# Patient Record
Sex: Female | Born: 1970 | Race: White | Hispanic: No | Marital: Single | Smoking: Current every day smoker
Health system: Southern US, Community
[De-identification: ages and names within clinical notes are randomized; demographics above are authoritative.]

## PROBLEM LIST (undated history)

## (undated) DIAGNOSIS — C569 Malignant neoplasm of unspecified ovary: Secondary | ICD-10-CM

## (undated) DIAGNOSIS — I10 Essential (primary) hypertension: Secondary | ICD-10-CM

## (undated) DIAGNOSIS — D649 Anemia, unspecified: Secondary | ICD-10-CM

## (undated) HISTORY — DX: Essential (primary) hypertension: I10

## (undated) HISTORY — DX: Malignant neoplasm of unspecified ovary: C56.9

## (undated) HISTORY — PX: FEMUR SURGERY: SHX943

---

## 1989-08-09 HISTORY — PX: FEMUR SURGERY: SHX943

## 1998-11-25 ENCOUNTER — Other Ambulatory Visit: Admission: RE | Admit: 1998-11-25 | Discharge: 1998-11-25 | Payer: Self-pay | Admitting: Obstetrics & Gynecology

## 1999-05-29 ENCOUNTER — Other Ambulatory Visit: Admission: RE | Admit: 1999-05-29 | Discharge: 1999-05-29 | Payer: Self-pay | Admitting: Obstetrics & Gynecology

## 2000-02-02 ENCOUNTER — Other Ambulatory Visit: Admission: RE | Admit: 2000-02-02 | Discharge: 2000-02-02 | Payer: Self-pay | Admitting: Obstetrics & Gynecology

## 2000-07-15 ENCOUNTER — Other Ambulatory Visit: Admission: RE | Admit: 2000-07-15 | Discharge: 2000-07-15 | Payer: Self-pay | Admitting: Family Medicine

## 2002-03-07 ENCOUNTER — Emergency Department (HOSPITAL_COMMUNITY): Admission: EM | Admit: 2002-03-07 | Discharge: 2002-03-07 | Payer: Self-pay | Admitting: Emergency Medicine

## 2002-03-12 ENCOUNTER — Emergency Department (HOSPITAL_COMMUNITY): Admission: EM | Admit: 2002-03-12 | Discharge: 2002-03-12 | Payer: Self-pay | Admitting: Emergency Medicine

## 2002-04-22 ENCOUNTER — Emergency Department (HOSPITAL_COMMUNITY): Admission: EM | Admit: 2002-04-22 | Discharge: 2002-04-22 | Payer: Self-pay | Admitting: Emergency Medicine

## 2002-07-18 ENCOUNTER — Emergency Department (HOSPITAL_COMMUNITY): Admission: EM | Admit: 2002-07-18 | Discharge: 2002-07-18 | Payer: Self-pay | Admitting: Emergency Medicine

## 2002-07-19 ENCOUNTER — Encounter: Payer: Self-pay | Admitting: Emergency Medicine

## 2003-04-29 ENCOUNTER — Emergency Department (HOSPITAL_COMMUNITY): Admission: EM | Admit: 2003-04-29 | Discharge: 2003-04-29 | Payer: Self-pay | Admitting: Emergency Medicine

## 2003-11-02 ENCOUNTER — Emergency Department (HOSPITAL_COMMUNITY): Admission: EM | Admit: 2003-11-02 | Discharge: 2003-11-03 | Payer: Self-pay | Admitting: Emergency Medicine

## 2003-12-30 ENCOUNTER — Other Ambulatory Visit: Admission: RE | Admit: 2003-12-30 | Discharge: 2003-12-30 | Payer: Self-pay | Admitting: Obstetrics and Gynecology

## 2006-06-20 ENCOUNTER — Emergency Department (HOSPITAL_COMMUNITY): Admission: EM | Admit: 2006-06-20 | Discharge: 2006-06-20 | Payer: Self-pay | Admitting: Emergency Medicine

## 2006-08-17 ENCOUNTER — Emergency Department (HOSPITAL_COMMUNITY): Admission: EM | Admit: 2006-08-17 | Discharge: 2006-08-18 | Payer: Self-pay | Admitting: Emergency Medicine

## 2010-10-08 ENCOUNTER — Emergency Department (HOSPITAL_COMMUNITY)
Admission: EM | Admit: 2010-10-08 | Discharge: 2010-10-08 | Discharge status: Against Medical Advice | Payer: Self-pay | Attending: Emergency Medicine | Admitting: Emergency Medicine

## 2010-10-08 DIAGNOSIS — F410 Panic disorder [episodic paroxysmal anxiety] without agoraphobia: Secondary | ICD-10-CM | POA: Insufficient documentation

## 2011-10-22 ENCOUNTER — Encounter (HOSPITAL_COMMUNITY): Payer: Self-pay | Admitting: Emergency Medicine

## 2011-10-22 ENCOUNTER — Emergency Department (HOSPITAL_COMMUNITY)
Admission: EM | Admit: 2011-10-22 | Discharge: 2011-10-22 | Disposition: A | Discharge status: Home / Self Care | Payer: Self-pay | Attending: Emergency Medicine | Admitting: Emergency Medicine

## 2011-10-22 ENCOUNTER — Emergency Department (HOSPITAL_COMMUNITY): Payer: Self-pay

## 2011-10-22 DIAGNOSIS — R10819 Abdominal tenderness, unspecified site: Secondary | ICD-10-CM | POA: Insufficient documentation

## 2011-10-22 DIAGNOSIS — M549 Dorsalgia, unspecified: Secondary | ICD-10-CM | POA: Insufficient documentation

## 2011-10-22 DIAGNOSIS — N949 Unspecified condition associated with female genital organs and menstrual cycle: Secondary | ICD-10-CM | POA: Insufficient documentation

## 2011-10-22 DIAGNOSIS — N83202 Unspecified ovarian cyst, left side: Secondary | ICD-10-CM

## 2011-10-22 DIAGNOSIS — N898 Other specified noninflammatory disorders of vagina: Secondary | ICD-10-CM | POA: Insufficient documentation

## 2011-10-22 DIAGNOSIS — R109 Unspecified abdominal pain: Secondary | ICD-10-CM | POA: Insufficient documentation

## 2011-10-22 DIAGNOSIS — R6883 Chills (without fever): Secondary | ICD-10-CM | POA: Insufficient documentation

## 2011-10-22 DIAGNOSIS — R110 Nausea: Secondary | ICD-10-CM | POA: Insufficient documentation

## 2011-10-22 DIAGNOSIS — F17200 Nicotine dependence, unspecified, uncomplicated: Secondary | ICD-10-CM | POA: Insufficient documentation

## 2011-10-22 DIAGNOSIS — N83209 Unspecified ovarian cyst, unspecified side: Secondary | ICD-10-CM | POA: Insufficient documentation

## 2011-10-22 LAB — URINALYSIS, ROUTINE W REFLEX MICROSCOPIC
Bilirubin Urine: NEGATIVE
Glucose, UA: NEGATIVE mg/dL
Ketones, ur: NEGATIVE mg/dL
Nitrite: NEGATIVE
Protein, ur: NEGATIVE mg/dL
Specific Gravity, Urine: 1.02 (ref 1.005–1.030)
Urobilinogen, UA: 0.2 mg/dL (ref 0.0–1.0)
pH: 5.5 (ref 5.0–8.0)

## 2011-10-22 LAB — CBC
HCT: 34.8 % — ABNORMAL LOW (ref 36.0–46.0)
Hemoglobin: 11.3 g/dL — ABNORMAL LOW (ref 12.0–15.0)
MCH: 25.8 pg — ABNORMAL LOW (ref 26.0–34.0)
MCHC: 32.5 g/dL (ref 30.0–36.0)
RDW: 17.4 % — ABNORMAL HIGH (ref 11.5–15.5)

## 2011-10-22 LAB — POCT I-STAT, CHEM 8
BUN: 11 mg/dL (ref 6–23)
Chloride: 104 mEq/L (ref 96–112)
Creatinine, Ser: 0.5 mg/dL (ref 0.50–1.10)
Potassium: 4.1 mEq/L (ref 3.5–5.1)
Sodium: 138 mEq/L (ref 135–145)
TCO2: 23 mmol/L (ref 0–100)

## 2011-10-22 LAB — DIFFERENTIAL
Basophils Absolute: 0.1 10*3/uL (ref 0.0–0.1)
Basophils Relative: 1 % (ref 0–1)
Eosinophils Absolute: 0.1 10*3/uL (ref 0.0–0.7)
Eosinophils Relative: 1 % (ref 0–5)
Monocytes Absolute: 1 10*3/uL (ref 0.1–1.0)
Monocytes Relative: 7 % (ref 3–12)

## 2011-10-22 LAB — URINE MICROSCOPIC-ADD ON

## 2011-10-22 LAB — WET PREP, GENITAL

## 2011-10-22 LAB — PREGNANCY, URINE: Preg Test, Ur: NEGATIVE

## 2011-10-22 MED ORDER — MORPHINE SULFATE 4 MG/ML IJ SOLN
4.0000 mg | Freq: Once | INTRAMUSCULAR | Status: AC
  Administered 2011-10-22: 4 mg via INTRAVENOUS
  Filled 2011-10-22: qty 1

## 2011-10-22 MED ORDER — DEXTROSE 5 % IV SOLN
2.0000 g | INTRAVENOUS | Status: AC
  Administered 2011-10-22: 2 g via INTRAVENOUS
  Filled 2011-10-22: qty 2

## 2011-10-22 MED ORDER — ONDANSETRON HCL 4 MG/2ML IJ SOLN
4.0000 mg | Freq: Once | INTRAMUSCULAR | Status: AC
  Administered 2011-10-22: 4 mg via INTRAVENOUS
  Filled 2011-10-22: qty 2

## 2011-10-22 MED ORDER — PROMETHAZINE HCL 25 MG RE SUPP: 25.0000 mg | Freq: Four times a day (QID) | RECTAL | Status: AC | PRN

## 2011-10-22 MED ORDER — HYDROMORPHONE HCL PF 1 MG/ML IJ SOLN
1.0000 mg | Freq: Once | INTRAMUSCULAR | Status: AC
  Administered 2011-10-22: 1 mg via INTRAVENOUS
  Filled 2011-10-22: qty 1

## 2011-10-22 MED ORDER — DOXYCYCLINE HYCLATE 100 MG IV SOLR
100.0000 mg | INTRAVENOUS | Status: AC
  Administered 2011-10-22: 100 mg via INTRAVENOUS
  Filled 2011-10-22: qty 100

## 2011-10-22 MED ORDER — DOXYCYCLINE HYCLATE 100 MG PO CAPS: 100.0000 mg | ORAL_CAPSULE | Freq: Two times a day (BID) | ORAL | Status: AC

## 2011-10-22 MED ORDER — SODIUM CHLORIDE 0.9 % IV SOLN
Freq: Once | INTRAVENOUS | Status: AC
  Administered 2011-10-22: 16:00:00 via INTRAVENOUS

## 2011-10-22 MED ORDER — OXYCODONE-ACETAMINOPHEN 5-325 MG PO TABS: 2.0000 | ORAL_TABLET | ORAL | Status: AC | PRN

## 2011-10-22 NOTE — Discharge Instructions (Signed)
Take percocet as needed for severe pain.  Do not drive within four hours of taking this medication (may cause drowsiness or confusion).  Take promethazine as prescribed for nausea.   Follow up with Dr. Normand Sloop on Monday if possible.  You should return to the ER if you develop fever, worsening pain or uncontrolled vomiting.

## 2011-10-22 NOTE — ED Notes (Signed)
This type of pain comes on at time of menses not at end of cycle. The pain is more severe in rt. Lower abdomen. No fevers, no n/v.

## 2011-10-22 NOTE — ED Provider Notes (Cosign Needed)
History     CSN: [id number]  Arrival date & time 10/22/11  3244   First MD Initiated Contact with Patient 10/22/11 1003      Chief Complaint  Patient presents with  . Abdominal Pain    (Consider location/radiation/quality/duration/timing/severity/associated sxs/prior treatment) Patient is a 41 y.o. female presenting with abdominal pain. The history is provided by the patient.  Abdominal Pain The primary symptoms of the illness include abdominal pain, nausea and vaginal bleeding. The primary symptoms of the illness do not include fever, vomiting, diarrhea, dysuria or vaginal discharge. The current episode started 3 to 5 hours ago. The onset of the illness was sudden. The problem has been gradually worsening.  Additional symptoms associated with the illness include chills and back pain. Symptoms associated with the illness do not include anorexia, constipation, urgency, hematuria or frequency.  Pt reports lower abdominal pain radiating into left side and into left flank. States pain worsened with movement and palpation. States she is at the end of her menustrual cycle on day 6. Denies urinary symptoms. Denies fever, chills, vomiting, diarrhea. Normal bowel movement today. Denies vaginal discharge.   History reviewed. No pertinent past medical history.  History reviewed. No pertinent past surgical history.  No family history on file.  History  Substance Use Topics  . Smoking status: Current Everyday Smoker  . Smokeless tobacco: Not on file  . Alcohol Use: Yes    OB History    Grav Para Term Preterm Abortions TAB SAB Ect Mult Living                  Review of Systems  Constitutional: Positive for chills. Negative for fever.  HENT: Negative.   Eyes: Negative.   Respiratory: Negative.   Cardiovascular: Negative.   Gastrointestinal: Positive for nausea and abdominal pain. Negative for vomiting, diarrhea, constipation and anorexia.  Genitourinary: Positive for vaginal bleeding.  Negative for dysuria, urgency, frequency, hematuria and vaginal discharge.  Musculoskeletal: Positive for back pain.  Skin: Negative.   Neurological: Negative.   Psychiatric/Behavioral: Negative.     Allergies  Penicillins  Home Medications   Current Outpatient Rx  Name Route Sig Dispense Refill  . ACETAMINOPHEN 500 MG PO TABS Oral Take 1,000 mg by mouth every 6 (six) hours as needed. For pain    . ADULT MULTIVITAMIN W/MINERALS CH Oral Take 1 tablet by mouth every other day.      BP 132/77  Pulse 116  Temp 98.3 F (36.8 C)  Resp 20  SpO2 99%  Physical Exam  Nursing note and vitals reviewed. Constitutional: She is oriented to person, place, and time. She appears well-developed and well-nourished.  HENT:  Head: Normocephalic.  Eyes: Conjunctivae are normal.  Neck: Neck supple.  Cardiovascular: Normal rate and regular rhythm.   Pulmonary/Chest: Effort normal and breath sounds normal.  Abdominal: Soft. Bowel sounds are normal. She exhibits no distension.       Suprapubic tenderness. No CVA tenderness  Genitourinary: Vagina normal. Uterus is tender. Cervix exhibits motion tenderness. Left adnexum displays tenderness.       Moderate vaginal bleeding with mucus noted  Musculoskeletal: Normal range of motion.  Neurological: She is alert and oriented to person, place, and time.  Skin: Skin is warm and dry.  Psychiatric: She has a normal mood and affect.    ED Course  Procedures (including critical care time)  Labs Reviewed  URINALYSIS, ROUTINE W REFLEX MICROSCOPIC - Abnormal; Notable for the following:    APPearance CLOUDY (*)  Hgb urine dipstick LARGE (*)    Leukocytes, UA LARGE (*)    All other components within normal limits  CBC - Abnormal; Notable for the following:    WBC 14.5 (*)    Hemoglobin 11.3 (*)    HCT 34.8 (*)    MCH 25.8 (*)    RDW 17.4 (*)    All other components within normal limits  DIFFERENTIAL - Abnormal; Notable for the following:    Neutro  Abs 10.9 (*)    All other components within normal limits  WET PREP, GENITAL - Abnormal; Notable for the following:    WBC, Wet Prep HPF POC MANY (*)    All other components within normal limits  POCT I-STAT, CHEM 8 - Abnormal; Notable for the following:    Glucose, Bld 111 (*)    All other components within normal limits  URINE MICROSCOPIC-ADD ON - Abnormal; Notable for the following:    Squamous Epithelial / LPF FEW (*)    All other components within normal limits  PREGNANCY, URINE  GC/CHLAMYDIA PROBE AMP, GENITAL  URINE CULTURE   No results found.  3:54 PM Pt with possible TOA vs torsed fallopian tube vs cystic structure mass over left adnexa. Spoke with Dr. Normand Sloop GYN, does not think this is TOA, pt afebrile. Also nothing needs to be done for torsed fallopian tube. If can get pain under control. Pt ok to be d/c home with follow up and antibiotics. She is to follow up with Dr. Normand Sloop in the office or go to [facility] if worsening.    No diagnosis found.    MDM          Lottie Mussel, PA 10/22/11 1635

## 2011-10-22 NOTE — ED Notes (Signed)
Lower abd  Rads to left flank and back  Stabbing pain  Denies dysuria started last night

## 2011-10-22 NOTE — ED Notes (Signed)
Urine culture sent as add-on

## 2011-10-22 NOTE — ED Notes (Signed)
Father in waiting room to pick up patient; patient aware.

## 2011-10-22 NOTE — ED Provider Notes (Signed)
Pt receiving IV abx in CDU for possible TOA.  She currently looks well and VSS.  Reports that the pain in her left lower abdomen is returning. Will treat w/ another dose of IV dilaudid and reassess when her abx are completed.  5:52 PM   Pt reports that pain has improved.  She is tolerating pos.  Abx are complete.  D/c'd home w/ percocet, promethazine and referral to Dr. Normand Sloop.  Strict return precautions discussed.   Susan Harper [place], [place] 10/22/11 1925

## 2011-10-23 LAB — URINE CULTURE: Culture: NO GROWTH

## 2011-10-26 NOTE — ED Notes (Signed)
+  GC Chart sent to EDP office for review.  

## 2011-11-01 ENCOUNTER — Emergency Department (HOSPITAL_COMMUNITY)
Admission: EM | Admit: 2011-11-01 | Discharge: 2011-11-01 | Disposition: A | Discharge status: Home / Self Care | Payer: Self-pay | Attending: Emergency Medicine | Admitting: Emergency Medicine

## 2011-11-01 ENCOUNTER — Encounter (HOSPITAL_COMMUNITY): Payer: Self-pay

## 2011-11-01 DIAGNOSIS — A5400 Gonococcal infection of lower genitourinary tract, unspecified: Secondary | ICD-10-CM | POA: Insufficient documentation

## 2011-11-01 DIAGNOSIS — A549 Gonococcal infection, unspecified: Secondary | ICD-10-CM

## 2011-11-01 DIAGNOSIS — F17200 Nicotine dependence, unspecified, uncomplicated: Secondary | ICD-10-CM | POA: Insufficient documentation

## 2011-11-01 MED ORDER — AZITHROMYCIN 1 G PO PACK
2.0000 g | PACK | Freq: Once | ORAL | Status: AC
  Administered 2011-11-01: 2 g via ORAL
  Filled 2011-11-01: qty 1

## 2011-11-01 MED ORDER — ONDANSETRON 4 MG PO TBDP
4.0000 mg | ORAL_TABLET | Freq: Once | ORAL | Status: AC
  Administered 2011-11-01: 4 mg via ORAL
  Filled 2011-11-01: qty 1

## 2011-11-01 NOTE — Discharge Instructions (Signed)
 RESOURCE GUIDE  Dental Problems  Patients with Medicaid: [facility]                     [facility] [address]                                           [address] Phone:  [phone]                                                  Phone:  [phone]  If unable to pay or uninsured, contact:  [facility] or [facility] to become qualified for the adult dental clinic.  Chronic Pain Problems Contact [facility]  [phone] Patients need to be referred by their primary care doctor.  Insufficient Money for Medicine Contact [employer]:  call "211" or [facility] 271-5999.  No Primary Care Doctor Call [facility]  832-8000 Other agencies that provide inexpensive medical care    [facility]  [phone]    [facility]  [phone]    [facility]  [phone]    Women[facility] Clinic  [phone]    [facility]  [phone]    [facility]  [phone]  Psychological Services [facility]  [phone] [facility]  [phone] [facility]   [phone] (emergency services 641-4993)  Substance Abuse Resources [facility]  336-882-2125 [facility] 336-784-9470 [facility] 336-285-9073 [facility] 336-845-3988 Residential & Outpatient Substance Abuse Program  800-659-3381  Abuse/Neglect [place] Child Abuse Hotline (336) 641-3795 [place] Child Abuse Hotline 800-378-5315 (After Hours)  Emergency Shelter [facility] (336) 271-5985  [facility] (336) 275-9566 [facility] (704) 372-4663  MRSA Hotline #:   832-7006    [place] [facility]  [facility] [place]     [employer]                          [facility] [address] [place]                       [address]      [address]  [place]                                                [place]                            [place] Phone:  [phone]                                   Phone:  [phone]                 Phone:  [phone]  [facility] Phone:    [phone]  [place] Child Abuse Hotline (336) 342-1394 (336) 342-3537 (After Hours)   

## 2011-11-01 NOTE — ED Provider Notes (Signed)
History    41 year old female presenting after she was called a positive gonorrhea culture. Patient reports that she still is having some mild lower abdominal pain. Afebrile. Continued mild vaginal discharge. Back pain. No urinary complaints. Patient reports her son does have improved since last evaluation and just no new complaints at this time. CSN: [id number]  Arrival date & time 11/01/11  1131   First MD Initiated Contact with Patient 11/01/11 1227      Chief Complaint  Patient presents with  . Vaginal Discharge    PT called back for vag culture results    (Consider location/radiation/quality/duration/timing/severity/associated sxs/prior treatment) HPI  No past medical history on file.  No past surgical history on file.  No family history on file.  History  Substance Use Topics  . Smoking status: Current Everyday Smoker  . Smokeless tobacco: Not on file  . Alcohol Use: Yes    OB History    Grav Para Term Preterm Abortions TAB SAB Ect Mult Living                  Review of Systems  Review of symptoms negative unless otherwise noted in HPI.  Allergies  Penicillins  Home Medications   Current Outpatient Rx  Name Route Sig Dispense Refill  . ACETAMINOPHEN 500 MG PO TABS Oral Take 1,000 mg by mouth every 6 (six) hours as needed. For pain    . DOXYCYCLINE HYCLATE 100 MG PO CAPS Oral Take 1 capsule (100 mg total) by mouth 2 (two) times daily. 14 capsule 0  . OXYCODONE-ACETAMINOPHEN 5-325 MG PO TABS Oral Take 2 tablets by mouth every 4 (four) hours as needed for pain. 20 tablet 0    BP 131/88  Pulse 99  Temp(Src) 98.3 F (36.8 C) (Oral)  Resp 18  SpO2 97%  LMP 10/17/2011  Physical Exam  Nursing note and vitals reviewed. Constitutional: She appears well-developed and well-nourished. No distress.  HENT:  Head: Normocephalic and atraumatic.  Eyes: Conjunctivae are normal. Right eye exhibits no discharge. Left eye exhibits no discharge.  Neck: Neck supple.    Cardiovascular: Normal rate, regular rhythm and normal heart sounds.  Exam reveals no gallop and no friction rub.   No murmur heard. Pulmonary/Chest: Effort normal and breath sounds normal. No respiratory distress.  Abdominal: Soft. She exhibits no distension. There is no tenderness.  Genitourinary:       No CVA tenderness  Musculoskeletal: She exhibits no edema and no tenderness.  Neurological: She is alert.  Skin: Skin is warm and dry.  Psychiatric: She has a normal mood and affect. Her behavior is normal. Thought content normal.    ED Course  Procedures (including critical care time)  Labs Reviewed - No data to display No results found.   1. Gonococcal infection       MDM  41 year old female with gonococcal infection. Patient is penicillin allergic so 2 g of azithromycin were given as a one-time dose. Abdominal exam benign. Low clinical suspicion for tubo-ovarian abscess or generally for surgical abdomen. Return precautions were discussed particularly given large cystic mass seen on previous ultrasound although it is unclear if specifically related to gonococcal infection. Patient is afebrile well-appearing on exam. Again encouraged to followup with GYN. Encouraged patient to talk with her sexual partners and to refrain from sexual activity until they have been tested and treated.        Raeford Razor, MD 11/12/11 [phone]

## 2011-11-02 NOTE — ED Notes (Signed)
Message left with father to have pt call office. (Need to question pt re: treatment of STD.)

## 2011-11-04 NOTE — ED Provider Notes (Signed)
Evaluation and management procedures were performed by the PA/NP under my supervision/collaboration.   Janeen Watson D Khylin Gutridge, MD 11/04/11 1021 

## 2011-11-08 NOTE — ED Notes (Signed)
Chart received from EDP 3/21 instructing patient to return to ED for treatment due to PCN allergy. Patient returned to ED on 3/25 and was treated with Zithromax two grams PO. [facility] faxed

## 2013-02-10 IMAGING — US US ART/VEN ABD/PELV/SCROTUM DOPPLER LTD
1 series · 12 of 25 positions shown · non-contrast
Comparison: None.

CLINICAL DATA: Abdominal and pelvic pain.  LMP 10/17/2011

TRANSABDOMINAL AND TRANSVAGINAL ULTRASOUND OF PELVIS
DOPPLER ULTRASOUND OF OVARIES
TECHNIQUE: Both transabdominal and transvaginal ultrasound
examinations of the pelvis were performed. Transabdominal technique
was performed for global imaging of the pelvis including uterus,
ovaries, adnexal regions, and pelvic cul-de-sac.
It was necessary to proceed with endovaginal exam following the
transabdominal exam to visualize the endometrium, myometrium and
adnexa.
Color and duplex Doppler ultrasound was utilized to evaluate blood
flow to the ovaries.

[Series 1: us art/ven abd/pelv/scrotum doppler ltd · 0.31mm/px · 12 of 66 slices shown]
[im 3/66]
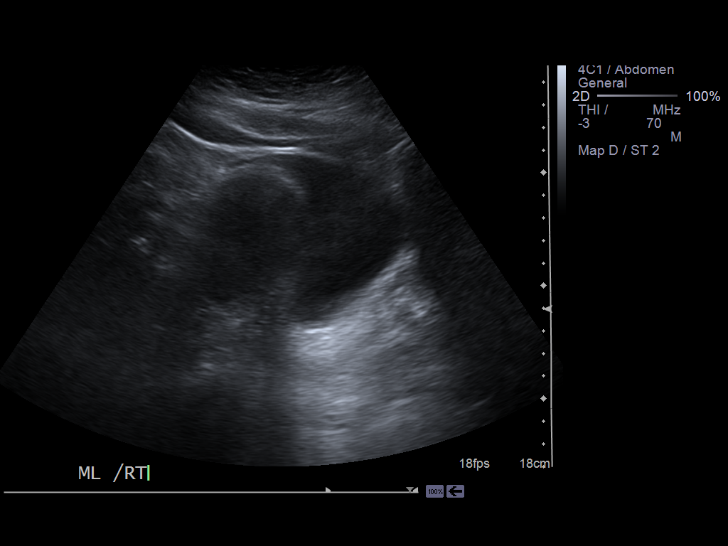
[im 9/66]
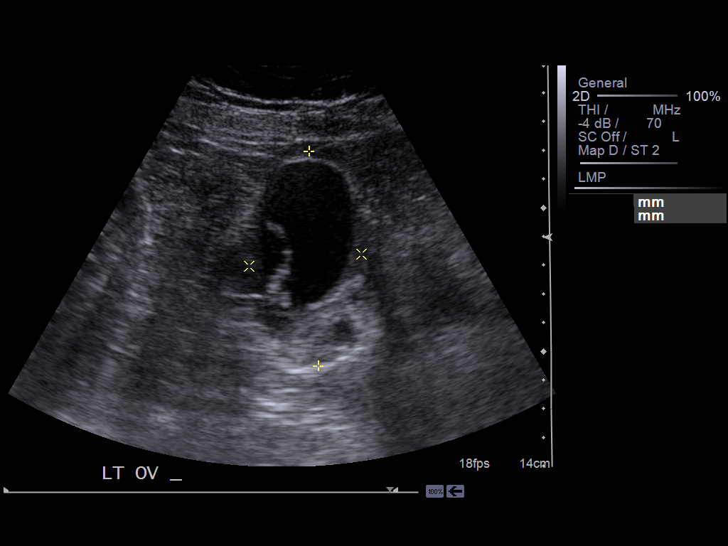
[im 14/66]
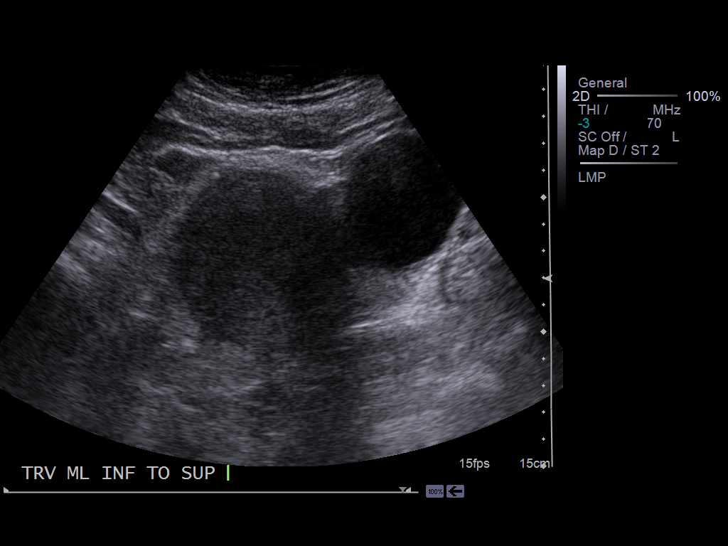
[im 19/66]
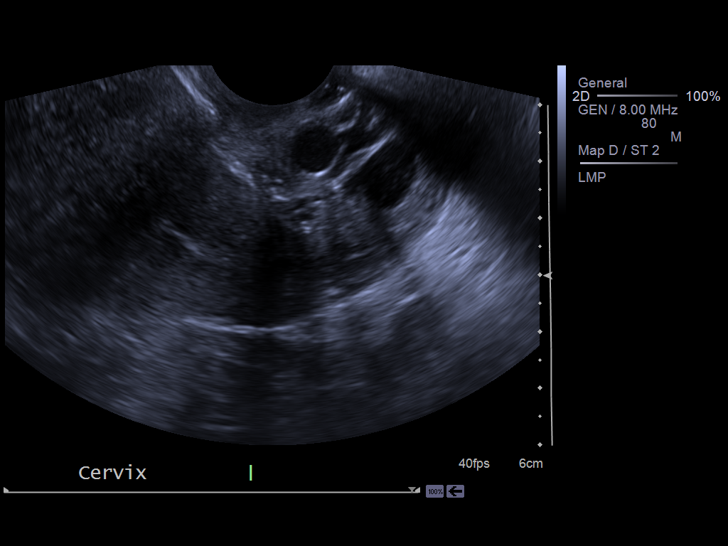
[im 25/66]
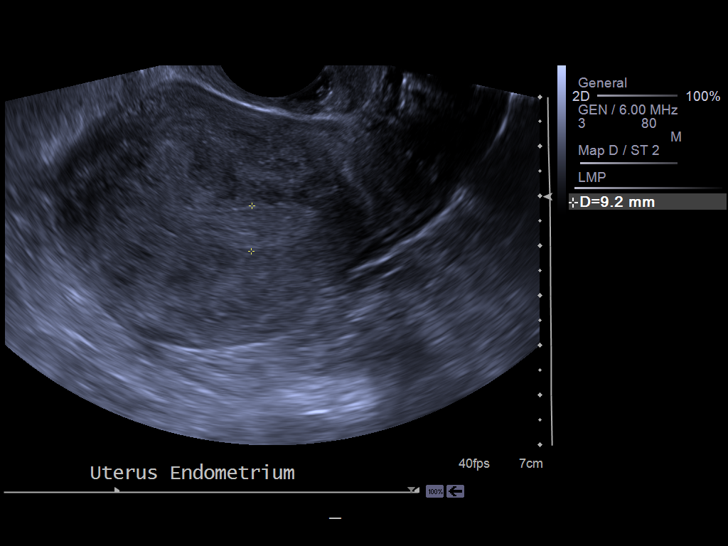
[im 30/66]
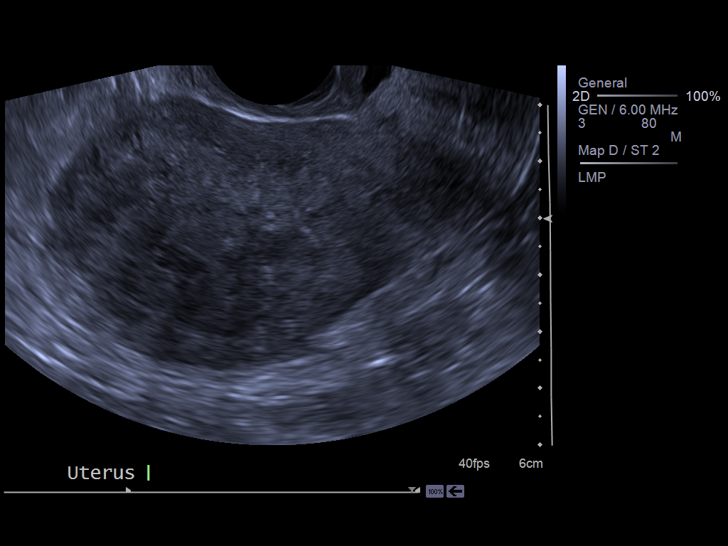
[im 36/66]
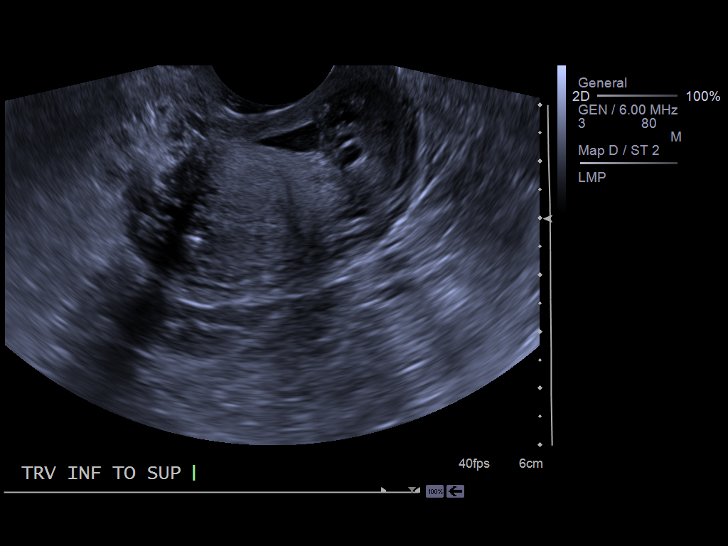
[im 41/66]
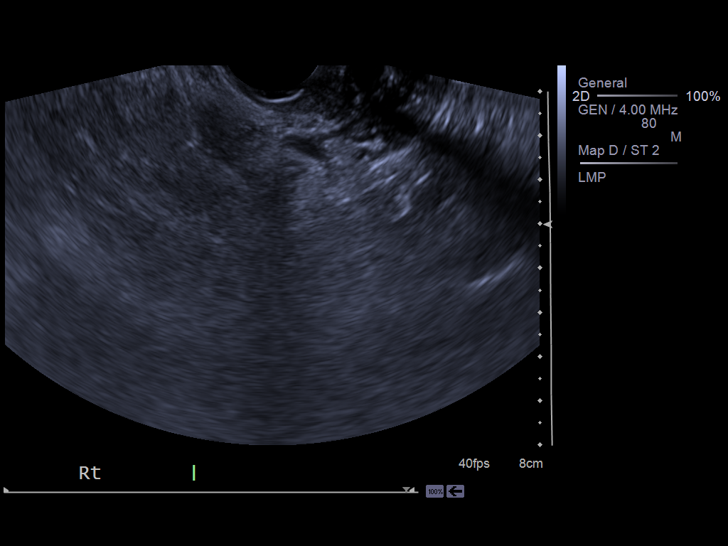
[im 47/66]
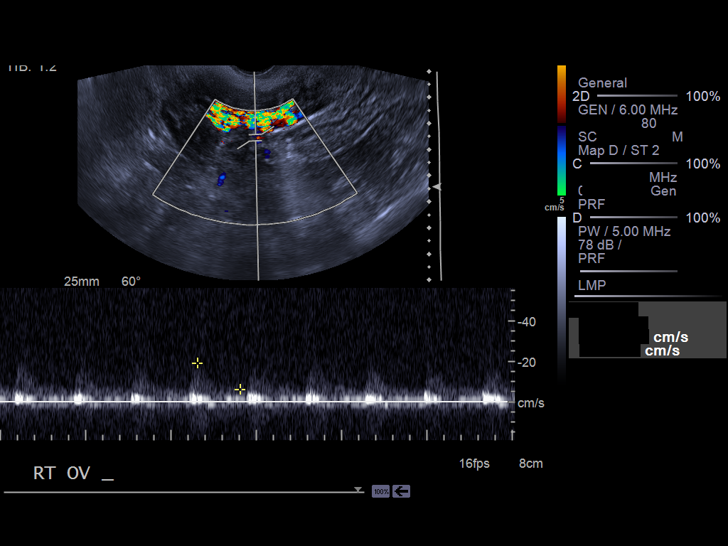
[im 52/66]
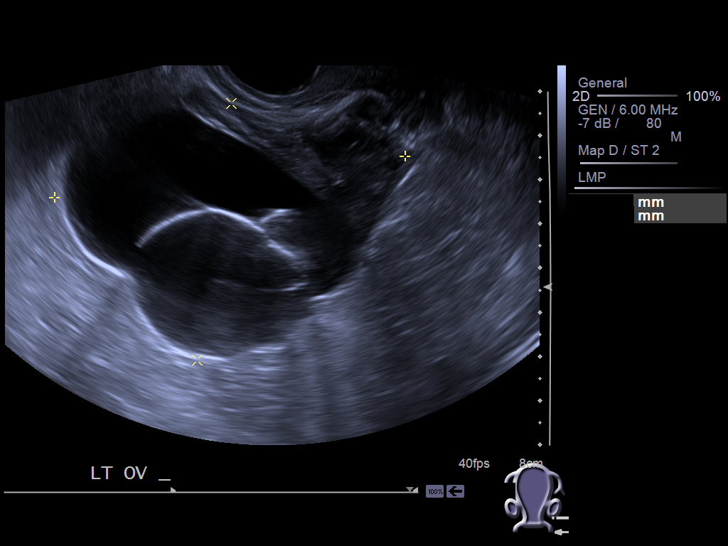
[im 57/66]
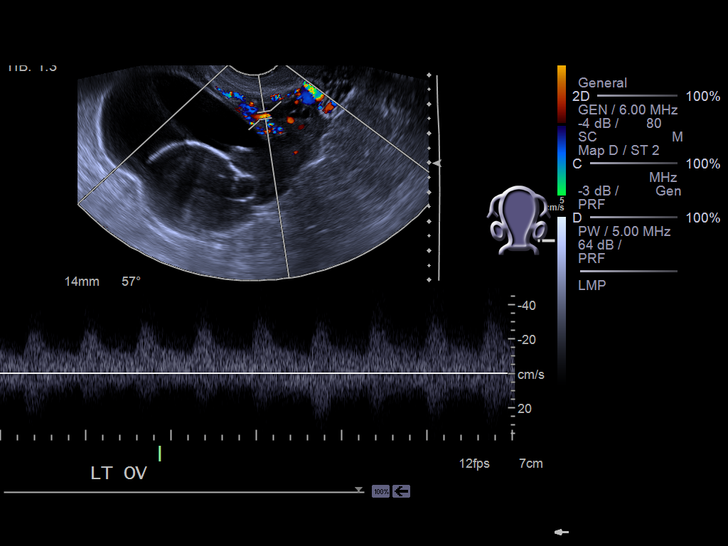
[im 63/66]
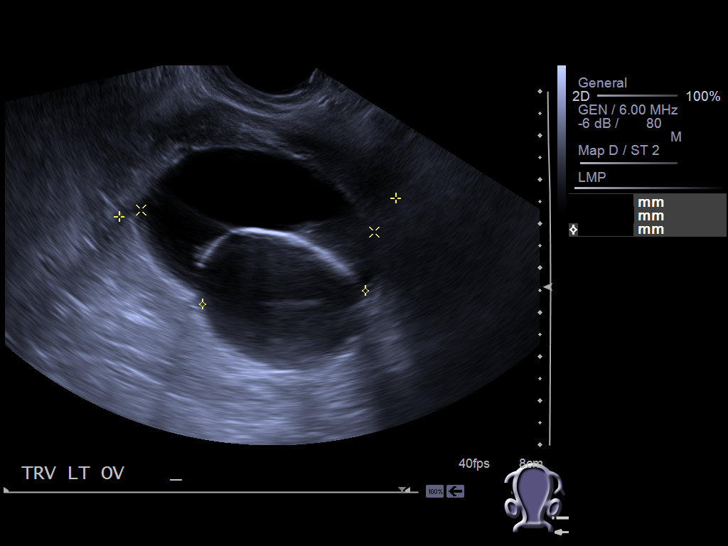

[12 of 25 positions shown; findings below may reference images not displayed]

FINDINGS: Uterus:  The uterus demonstrates a sagittal length of 9.2 cm, depth
of 5.0 cm and width of 6.3 cm. Overall the myometrium appears
heterogeneous with at least three areas of focally altered
echotexture appear more suspicious for focal fibroids, one mural
located in the anterior upper uterine segment measuring 1.4 x 0.8 x
1.0 cm, a second mural located in the posterior upper uterine
segment measuring 1.2 x 1.5 x 1.5 cm and a third in the posterior
upper uterine segment with a small subserosal component measuring
1.2 by 1.2 x 0.9 cm. The more diffuse heterogeneity combined with a
poor endometrial myometrial interface may indicate associated
adenomyosis.

Endometrium: myometrial interface is poorly defined.  The
visualized portion of the endometrial lining appears echogenic with
a width of 9 mm.

Right ovary: Measures 3.0 x 2.5 x 2.0 cm and is poorly resolved due
to the location on the endovaginal exam color Doppler reveals the
presence of flow within this ovary

Left ovary:   Normal ovarian stroma is identified measuring 1.7 by
2.1 x 2.3 cm.  Adjacent to this is a multicystic mass which
measures 6.7 by 4.7 by 3.8 cm.  It is difficult to tell whether
this is contiguous with or adjacent to the left ovary.  The cystic
mass is avascular and contains several thin walled rounded cystic
components with some internal echoes noted. No wall nodularity is
seen.  This could represent either a dilated tube or multicystic
ovarian mass.

Pulsed Doppler evaluation demonstrates normal low-resistance
arterial and venous waveforms in the right ovary and in the normal
appearing ovarian stroma on the left.
IMPRESSION: Complex cystic left adnexal mass which is questionably adjacent to
or contiguous with the left ovary.  This could represent either a
hydrosalpinx with some hemorrhage or a cystic ovarian mass.  In the
appropriate clinical setting, a torsed fallopian tube could be
considered.  Further characterization with pelvic MRI may be able
to help distinguish between these possibilities.

Uterine fibroids with question of uterine adenomyosis.

Normal right ovary.

This report was discussed with Samibyan Zampavion who reports the
presence of white cells on wet prep. Given this history a
pyosalpinx could also cause this appearance.

## 2015-09-08 ENCOUNTER — Encounter (HOSPITAL_BASED_OUTPATIENT_CLINIC_OR_DEPARTMENT_OTHER): Payer: Self-pay | Admitting: *Deleted

## 2015-09-08 ENCOUNTER — Emergency Department (HOSPITAL_BASED_OUTPATIENT_CLINIC_OR_DEPARTMENT_OTHER): Payer: Self-pay

## 2015-09-08 ENCOUNTER — Emergency Department (HOSPITAL_BASED_OUTPATIENT_CLINIC_OR_DEPARTMENT_OTHER)
Admission: EM | Admit: 2015-09-08 | Discharge: 2015-09-08 | Disposition: A | Discharge status: Home / Self Care | Payer: Self-pay | Attending: Emergency Medicine | Admitting: Emergency Medicine

## 2015-09-08 DIAGNOSIS — Z880 Allergy status to penicillin: Secondary | ICD-10-CM | POA: Insufficient documentation

## 2015-09-08 DIAGNOSIS — M7711 Lateral epicondylitis, right elbow: Secondary | ICD-10-CM | POA: Insufficient documentation

## 2015-09-08 DIAGNOSIS — F17210 Nicotine dependence, cigarettes, uncomplicated: Secondary | ICD-10-CM | POA: Insufficient documentation

## 2015-09-08 MED ORDER — TRAMADOL HCL 50 MG PO TABS: 50.0000 mg | ORAL_TABLET | Freq: Four times a day (QID) | ORAL | Status: DC | PRN

## 2015-09-08 MED FILL — traMADol HCL 50 MG TABS: 50 | 5 days supply | Qty: 20 | Fill #0

## 2015-09-08 NOTE — Discharge Instructions (Signed)
It was our pleasure to provide your ER care today - we hope that you feel better.  Try to rest area, avoid repetitive use of elbow, and/or repetitive lifting for 3-4 days.  Try orthotic band/strap for symptom relief.  Ice/cold to sore area.  Take motrin or aleve as need for pain.  You may also take ultram as need for pain - no driving when taking.  Follow up with orthopedist in the next few weeks for recheck if symptoms fail to improve/resolve.  Return to ER if worse, new symptoms, fevers, spreading redness, other concern.    Tennis Elbow Tennis elbow (lateral epicondylitis) is inflammation of the outer tendons of your forearm close to your elbow. Your tendons attach your muscles to your bones. The outer tendons of your forearm are used to extend your wrist, and they attach on the outside part of your elbow. Tennis elbow is often found in people who play tennis, but anyone may get the condition from repeatedly extending the wrist or turning the forearm. CAUSES This condition is caused by repeatedly extending your wrist and using your hands. It can result from sports or work that requires repetitive forearm movements. Tennis elbow may also be caused by an injury. RISK FACTORS You have a higher risk of developing tennis elbow if you play tennis or another racquet sport. You also have a higher risk if you frequently use your hands for work. This condition is also more likely to develop in:  Musicians.  Carpenters, painters, and plumbers.  Cooks.  Cashiers.  People who work in [employer].  Architect workers.  Butchers.  People who use computers. SYMPTOMS Symptoms of this condition include:  Pain and tenderness in your forearm and the outer part of your elbow. You may only feel the pain when you use your arm, or you may feel it even when you are not using your arm.  A burning feeling that runs from your elbow through your arm.  Weak grip in your hands. DIAGNOSIS  This  condition may be diagnosed by medical history and physical exam. You may also have other tests, including:  X-rays.  MRI. TREATMENT Your health care provider will recommend lifestyle adjustments, such as resting and icing your arm. Treatment may also include:  Medicines for inflammation. This may include shots of cortisone if your pain continues.  Physical therapy. This may include massage or exercises.  An elbow brace. Surgery may eventually be recommended if your pain does not go away with treatment. HOME CARE INSTRUCTIONS Activity  Rest your elbow and wrist as directed by your health care provider. Try to avoid any activities that caused the problem until your health care provider says that you can do them again.  If a physical therapist teaches you exercises, do all of them as directed.  If you lift an object, lift it with your palm facing upward. This lowers the stress on your elbow. Lifestyle  If your tennis elbow is caused by sports, check your equipment and make sure that:  You are using it correctly.  It is the best fit for you.  If your tennis elbow is caused by work, take breaks frequently, if you are able. Talk with your manager about how to best perform tasks in a way that is safe.  If your tennis elbow is caused by computer use, talk with your manager about any changes that can be made to your work environment. General Instructions  If directed, apply ice to the painful area:  Put ice in a plastic bag.  Place a towel between your skin and the bag.  Leave the ice on for 20 minutes, 2-3 times per day.  Take medicines only as directed by your health care provider.  If you were given a brace, wear it as directed by your health care provider.  Keep all follow-up visits as directed by your health care provider. This is important. SEEK MEDICAL CARE IF:  Your pain does not get better with treatment.  Your pain gets worse.  You have numbness or weakness in  your forearm, hand, or fingers.   This information is not intended to replace advice given to you by your health care provider. Make sure you discuss any questions you have with your health care provider.   Document Released: 07/26/2005 Document Revised: 12/10/2014 Document Reviewed: 07/22/2014 Elsevier Interactive Patient Education 2016 [place].   Cryotherapy Cryotherapy means treatment with cold. Ice or gel packs can be used to reduce both pain and swelling. Ice is the most helpful within the first 24 to 48 hours after an injury or flare-up from overusing a muscle or joint. Sprains, strains, spasms, burning pain, shooting pain, and aches can all be eased with ice. Ice can also be used when recovering from surgery. Ice is effective, has very few side effects, and is safe for most people to use. PRECAUTIONS  Ice is not a safe treatment option for people with:  Raynaud phenomenon. This is a condition affecting small blood vessels in the extremities. Exposure to cold may cause your problems to return.  Cold hypersensitivity. There are many forms of cold hypersensitivity, including:  Cold urticaria. Red, itchy hives appear on the skin when the tissues begin to warm after being iced.  Cold erythema. This is a red, itchy rash caused by exposure to cold.  Cold hemoglobinuria. Red blood cells break down when the tissues begin to warm after being iced. The hemoglobin that carry oxygen are passed into the urine because they cannot combine with blood proteins fast enough.  Numbness or altered sensitivity in the area being iced. If you have any of the following conditions, do not use ice until you have discussed cryotherapy with your caregiver:  Heart conditions, such as arrhythmia, angina, or chronic heart disease.  High blood pressure.  Healing wounds or open skin in the area being iced.  Current infections.  Rheumatoid arthritis.  Poor circulation.  Diabetes. Ice slows the blood  flow in the region it is applied. This is beneficial when trying to stop inflamed tissues from spreading irritating chemicals to surrounding tissues. However, if you expose your skin to cold temperatures for too long or without the proper protection, you can damage your skin or nerves. Watch for signs of skin damage due to cold. HOME CARE INSTRUCTIONS Follow these tips to use ice and cold packs safely.  Place a dry or damp towel between the ice and skin. A damp towel will cool the skin more quickly, so you may need to shorten the time that the ice is used.  For a more rapid response, add gentle compression to the ice.  Ice for no more than 10 to 20 minutes at a time. The bonier the area you are icing, the less time it will take to get the benefits of ice.  Check your skin after 5 minutes to make sure there are no signs of a poor response to cold or skin damage.  Rest 20 minutes or more between uses.  Once your skin is numb, you can end your treatment. You can test numbness by very lightly touching your skin. The touch should be so light that you do not see the skin dimple from the pressure of your fingertip. When using ice, most people will feel these normal sensations in this order: cold, burning, aching, and numbness.  Do not use ice on someone who cannot communicate their responses to pain, such as small children or people with dementia. HOW TO MAKE AN ICE PACK Ice packs are the most common way to use ice therapy. Other methods include ice massage, ice baths, and cryosprays. Muscle creams that cause a cold, tingly feeling do not offer the same benefits that ice offers and should not be used as a substitute unless recommended by your caregiver. To make an ice pack, do one of the following:  Place crushed ice or a bag of frozen vegetables in a sealable plastic bag. Squeeze out the excess air. Place this bag inside another plastic bag. Slide the bag into a pillowcase or place a damp towel  between your skin and the bag.  Mix 3 parts water with 1 part rubbing alcohol. Freeze the mixture in a sealable plastic bag. When you remove the mixture from the freezer, it will be slushy. Squeeze out the excess air. Place this bag inside another plastic bag. Slide the bag into a pillowcase or place a damp towel between your skin and the bag. SEEK MEDICAL CARE IF:  You develop white spots on your skin. This may give the skin a blotchy (mottled) appearance.  Your skin turns blue or pale.  Your skin becomes waxy or hard.  Your swelling gets worse. MAKE SURE YOU:   Understand these instructions.  Will watch your condition.  Will get help right away if you are not doing well or get worse.   This information is not intended to replace advice given to you by your health care provider. Make sure you discuss any questions you have with your health care provider.   Document Released: 03/22/2011 Document Revised: 08/16/2014 Document Reviewed: 03/22/2011 Elsevier Interactive Patient Education [employer].

## 2015-09-08 NOTE — ED Notes (Signed)
Right elbow pain x 2 months. Pain started after hitting her arm but it has gotten worse over the past 2 days.

## 2015-09-08 NOTE — ED Provider Notes (Signed)
CSN: [id number]     Arrival date & time 09/08/15  1059 History   First MD Initiated Contact with Patient 09/08/15 1232     Chief Complaint  Patient presents with  . Elbow Pain     (Consider location/radiation/quality/duration/timing/severity/associated sxs/prior Treatment) The history is provided by the patient.  Patient c/o right elbow pain for the past 2 months. States initially had struck elbow on something at onset pain. Pain persists. Works as Educational psychologist. Repetitive lifting/bending of arm. Movement and palpation makes worse. No acute or abrupt worsening today or this week. No redness. No sports injury, does not play racquet sports or golf. No fever or chills. No numbness/weakness.     History reviewed. No pertinent past medical history. Past Surgical History  Procedure Laterality Date  . Femur surgery     No family history on file. Social History  Substance Use Topics  . Smoking status: Current Every Day Smoker -- 1.00 packs/day    Types: Cigarettes  . Smokeless tobacco: None  . Alcohol Use: Yes   OB History    No data available     Review of Systems  Constitutional: Negative for fever and chills.  Musculoskeletal: Negative for neck pain.  Skin: Negative for rash and wound.  Neurological: Negative for weakness and numbness.      Allergies  Penicillins  Home Medications   Prior to Admission medications   Medication Sig Start Date End Date Taking? Authorizing Provider  acetaminophen (TYLENOL) 500 MG tablet Take 1,000 mg by mouth every 6 (six) hours as needed. For pain    Historical Provider, MD   BP 149/85 mmHg  Pulse 94  Temp(Src) 98.1 F (36.7 C) (Oral)  Resp 16  Ht 5' 5.5" (1.664 m)  Wt 86.183 kg  BMI 31.13 kg/m2  SpO2 100%  LMP 09/01/2015 Physical Exam  Constitutional: She appears well-developed and well-nourished. No distress.  Eyes: Conjunctivae are normal. No scleral icterus.  Neck: Neck supple.  Cardiovascular: Intact distal pulses.    Pulmonary/Chest: Effort normal. No respiratory distress.  Abdominal: Normal appearance.  Musculoskeletal:  Tenderness lateral elbow, lateral epicondyle. No pain w passive rom elbow. Radial pulse 2+. No erythema or increased warmth.   Neurological: She is alert.  RUE/hand, motor 5/5. sens grossly intact.   Skin: Skin is warm and dry. No rash noted. She is not diaphoretic.  Psychiatric: She has a normal mood and affect.  Nursing note and vitals reviewed.   ED Course  Procedures (including critical care time)  Dg Elbow Complete Right  09/08/2015  CLINICAL DATA:  Eight week history of elbow pain.  No known injury. EXAM: RIGHT ELBOW - COMPLETE 3+ VIEW COMPARISON:  None. FINDINGS: The joint spaces are maintained. No acute fracture is identified. No osteochondral abnormality or joint effusion. IMPRESSION: No acute bony findings or joint effusion. Electronically Signed   By: Marijo Sanes M.D.   On: 09/08/2015 11:48   I have personally reviewed and evaluated these images as part of my medical decision-making.    MDM   Exam appears c/w lateral epicondylitis.   rec nsaid.  Pt requests additional pain rx - will give rx ultram.    Discussed rest, ice, PT, band/strap, and ortho f/u if fails to improve.     Lajean Saver, MD 09/08/15 1240

## 2015-10-11 ENCOUNTER — Emergency Department (HOSPITAL_COMMUNITY): Payer: Self-pay

## 2015-10-11 ENCOUNTER — Observation Stay (HOSPITAL_COMMUNITY)
Admission: EM | Admit: 2015-10-11 | Discharge: 2015-10-12 | Disposition: A | Discharge status: Home / Self Care | Payer: Self-pay | Attending: Internal Medicine | Admitting: Internal Medicine

## 2015-10-11 ENCOUNTER — Encounter (HOSPITAL_COMMUNITY): Payer: Self-pay | Admitting: Emergency Medicine

## 2015-10-11 DIAGNOSIS — F17210 Nicotine dependence, cigarettes, uncomplicated: Secondary | ICD-10-CM | POA: Insufficient documentation

## 2015-10-11 DIAGNOSIS — Z23 Encounter for immunization: Secondary | ICD-10-CM | POA: Insufficient documentation

## 2015-10-11 DIAGNOSIS — F17200 Nicotine dependence, unspecified, uncomplicated: Secondary | ICD-10-CM

## 2015-10-11 DIAGNOSIS — J189 Pneumonia, unspecified organism: Secondary | ICD-10-CM | POA: Diagnosis present

## 2015-10-11 DIAGNOSIS — D509 Iron deficiency anemia, unspecified: Secondary | ICD-10-CM | POA: Insufficient documentation

## 2015-10-11 DIAGNOSIS — J1100 Influenza due to unidentified influenza virus with unspecified type of pneumonia: Principal | ICD-10-CM

## 2015-10-11 HISTORY — DX: Anemia, unspecified: D64.9

## 2015-10-11 LAB — CBC WITH DIFFERENTIAL/PLATELET
Basophils Absolute: 0 10*3/uL (ref 0.0–0.1)
Basophils Relative: 0 %
EOS PCT: 2 %
Eosinophils Absolute: 0.2 10*3/uL (ref 0.0–0.7)
HCT: 31 % — ABNORMAL LOW (ref 36.0–46.0)
Hemoglobin: 9.1 g/dL — ABNORMAL LOW (ref 12.0–15.0)
LYMPHS ABS: 0.6 10*3/uL — AB (ref 0.7–4.0)
Lymphocytes Relative: 6 %
MCH: 21.4 pg — AB (ref 26.0–34.0)
MCHC: 29.4 g/dL — ABNORMAL LOW (ref 30.0–36.0)
MCV: 72.9 fL — AB (ref 78.0–100.0)
MONOS PCT: 4 %
Monocytes Absolute: 0.4 10*3/uL (ref 0.1–1.0)
NEUTROS ABS: 8.8 10*3/uL — AB (ref 1.7–7.7)
Neutrophils Relative %: 88 %
PLATELETS: 353 10*3/uL (ref 150–400)
RBC: 4.25 MIL/uL (ref 3.87–5.11)
RDW: 17.7 % — AB (ref 11.5–15.5)
WBC: 10 10*3/uL (ref 4.0–10.5)

## 2015-10-11 LAB — FERRITIN: FERRITIN: 10 ng/mL — AB (ref 11–307)

## 2015-10-11 LAB — INFLUENZA PANEL BY PCR (TYPE A & B)
H1N1FLUPCR: NOT DETECTED
Influenza A By PCR: POSITIVE — AB
Influenza B By PCR: NEGATIVE

## 2015-10-11 LAB — BASIC METABOLIC PANEL
ANION GAP: 10 (ref 5–15)
BUN: 8 mg/dL (ref 6–20)
CALCIUM: 9.6 mg/dL (ref 8.9–10.3)
CO2: 23 mmol/L (ref 22–32)
CREATININE: 0.39 mg/dL — AB (ref 0.44–1.00)
Chloride: 107 mmol/L (ref 101–111)
Glucose, Bld: 104 mg/dL — ABNORMAL HIGH (ref 65–99)
Potassium: 4.2 mmol/L (ref 3.5–5.1)
Sodium: 140 mmol/L (ref 135–145)

## 2015-10-11 LAB — IRON AND TIBC
IRON: 13 ug/dL — AB (ref 28–170)
SATURATION RATIOS: 2 % — AB (ref 10.4–31.8)
TIBC: 549 ug/dL — ABNORMAL HIGH (ref 250–450)
UIBC: 536 ug/dL

## 2015-10-11 LAB — I-STAT TROPONIN, ED: Troponin i, poc: 0 ng/mL (ref 0.00–0.08)

## 2015-10-11 LAB — I-STAT CG4 LACTIC ACID, ED: Lactic Acid, Venous: 0.67 mmol/L (ref 0.5–2.0)

## 2015-10-11 LAB — D-DIMER, QUANTITATIVE: D-Dimer, Quant: 0.37 ug/mL-FEU (ref 0.00–0.50)

## 2015-10-11 MED ORDER — IPRATROPIUM-ALBUTEROL 0.5-2.5 (3) MG/3ML IN SOLN
3.0000 mL | Freq: Four times a day (QID) | RESPIRATORY_TRACT | Status: DC
Start: 2015-10-12 — End: 2015-10-12
  Administered 2015-10-12 (×2): 3 mL via RESPIRATORY_TRACT
  Filled 2015-10-11: qty 3

## 2015-10-11 MED ORDER — PREDNISONE 20 MG PO TABS
40.0000 mg | ORAL_TABLET | Freq: Every day | ORAL | Status: DC
  Administered 2015-10-12: 40 mg via ORAL
  Filled 2015-10-11: qty 2

## 2015-10-11 MED ORDER — IPRATROPIUM-ALBUTEROL 0.5-2.5 (3) MG/3ML IN SOLN: 3.0000 mL | Freq: Four times a day (QID) | RESPIRATORY_TRACT | Status: DC

## 2015-10-11 MED ORDER — ACETAMINOPHEN 325 MG PO TABS
650.0000 mg | ORAL_TABLET | Freq: Once | ORAL | Status: AC
  Administered 2015-10-11: 650 mg via ORAL
  Filled 2015-10-11: qty 2

## 2015-10-11 MED ORDER — IBUPROFEN 400 MG PO TABS
400.0000 mg | ORAL_TABLET | ORAL | Status: DC | PRN
  Administered 2015-10-11 – 2015-10-12 (×2): 400 mg via ORAL
  Filled 2015-10-11 (×2): qty 1

## 2015-10-11 MED ORDER — ALBUTEROL SULFATE (2.5 MG/3ML) 0.083% IN NEBU
2.5000 mg | INHALATION_SOLUTION | RESPIRATORY_TRACT | Status: DC | PRN
  Filled 2015-10-11: qty 3

## 2015-10-11 MED ORDER — LEVOFLOXACIN 750 MG PO TABS
750.0000 mg | ORAL_TABLET | Freq: Every day | ORAL | Status: DC
  Administered 2015-10-12: 750 mg via ORAL
  Filled 2015-10-11: qty 1

## 2015-10-11 MED ORDER — NICOTINE 21 MG/24HR TD PT24
21.0000 mg | MEDICATED_PATCH | Freq: Every day | TRANSDERMAL | Status: DC
  Administered 2015-10-11: 21 mg via TRANSDERMAL
  Filled 2015-10-11: qty 1

## 2015-10-11 MED ORDER — METHYLPREDNISOLONE SODIUM SUCC 125 MG IJ SOLR
125.0000 mg | Freq: Once | INTRAMUSCULAR | Status: AC
  Administered 2015-10-11: 125 mg via INTRAVENOUS
  Filled 2015-10-11: qty 2

## 2015-10-11 MED ORDER — ACETAMINOPHEN 500 MG PO TABS
1000.0000 mg | ORAL_TABLET | Freq: Three times a day (TID) | ORAL | Status: DC | PRN
  Administered 2015-10-11 – 2015-10-12 (×3): 1000 mg via ORAL
  Filled 2015-10-11 (×3): qty 2

## 2015-10-11 MED ORDER — IPRATROPIUM-ALBUTEROL 0.5-2.5 (3) MG/3ML IN SOLN
3.0000 mL | Freq: Once | RESPIRATORY_TRACT | Status: AC
  Administered 2015-10-11: 3 mL via RESPIRATORY_TRACT
  Filled 2015-10-11: qty 3

## 2015-10-11 MED ORDER — ENOXAPARIN SODIUM 40 MG/0.4ML ~~LOC~~ SOLN
40.0000 mg | SUBCUTANEOUS | Status: DC
  Administered 2015-10-11: 40 mg via SUBCUTANEOUS
  Filled 2015-10-11: qty 0.4

## 2015-10-11 MED ORDER — LORAZEPAM 2 MG/ML IJ SOLN
0.5000 mg | Freq: Once | INTRAMUSCULAR | Status: AC
  Administered 2015-10-11: 0.5 mg via INTRAVENOUS
  Filled 2015-10-11: qty 1

## 2015-10-11 MED ORDER — LEVOFLOXACIN 750 MG PO TABS
750.0000 mg | ORAL_TABLET | Freq: Once | ORAL | Status: AC
Start: 2015-10-11 — End: 2015-10-11
  Administered 2015-10-11: 750 mg via ORAL
  Filled 2015-10-11: qty 1

## 2015-10-11 MED ORDER — SODIUM CHLORIDE 0.9 % IV BOLUS (SEPSIS)
1000.0000 mL | Freq: Once | INTRAVENOUS | Status: AC
  Administered 2015-10-11: 1000 mL via INTRAVENOUS

## 2015-10-11 MED ORDER — LORATADINE 10 MG PO TABS
10.0000 mg | ORAL_TABLET | Freq: Every day | ORAL | Status: DC
  Administered 2015-10-11 – 2015-10-12 (×2): 10 mg via ORAL
  Filled 2015-10-11 (×2): qty 1

## 2015-10-11 MED ORDER — IPRATROPIUM-ALBUTEROL 0.5-2.5 (3) MG/3ML IN SOLN
3.0000 mL | RESPIRATORY_TRACT | Status: DC
  Administered 2015-10-11 (×2): 3 mL via RESPIRATORY_TRACT
  Filled 2015-10-11 (×2): qty 3

## 2015-10-11 NOTE — H&P (Signed)
Date: 10/11/2015               Patient Name:  Susan Harper MRN: [id number]  DOB: [birth year] Age / Sex: 45 y.o., female   PCP: Provider Default, MD         Medical Service: Internal Medicine Teaching Service         Attending Physician: Dr. Sid Falcon, MD    First Contact: Dr. Lovena Le Pager: [phone]  Second Contact: Dr. Randell Patient Pager: [phone]       After Hours (After 5p/  First Contact Pager: [phone]  weekends / holidays): Second Contact Pager: [phone]   Chief Complaint: SOB, chest tightness, muscle aches  History of Present Illness: [name] is a 45 yo female with current tobacco use, presenting with 1 day h/o SOB.  Patient reports feeling completely normal yesterday.  This morning, she first noticed muscle aches, shortness of breath, and her "skin crawling."  She then felt worsening chest tightness, heart racing, and anxiety.  Nothing is making it better or worse.  She denies chest pain.  She has fever, chills, HA, runny nose, cough, postnasal drip, congestion, and ear pain.  She denies weight gain, leg swelling, or sick contacts.  She did not get a flu shot.  She is not sure if the anxiety preceded the SOB or vice versa.  She has no h/o asthma or COPD, though she is a current tobacco smoker (1/2 ppd x 20 years; max 1 ppd).  She denies h/o HTN, HLD, MI, or CHF.  She has never had an episode like this before.  She has had PNA in the past, but last occurrence was >20 years ago.   PMH: none Allergies: PCN (eye swelling) FHx: No FHx of COPD. 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  She smokes 1/2 ppd.  She drinks 1-2 beers once a week.  She denies drugs.  In the ED, she was febrile to 38.3C.  CXR noted RML infiltrate.  She was given Levaquin IV, Duoneb, and IV Solumedrol.  D-dimer was negative.     Meds: Current Facility-Administered Medications  Medication Dose Route Frequency Provider Last Rate Last Dose  . albuterol  (PROVENTIL) (2.5 MG/3ML) 0.083% nebulizer solution 2.5 mg  2.5 mg Nebulization Q2H PRN Milagros Loll, MD      . ipratropium-albuterol (DUONEB) 0.5-2.5 (3) MG/3ML nebulizer solution 3 mL  3 mL Nebulization Q4H Milagros Loll, MD   3 mL at 10/11/15 1718  . [START ON 10/12/2015] levofloxacin (LEVAQUIN) tablet 750 mg  750 mg Oral Daily Milagros Loll, MD      . nicotine (NICODERM CQ - dosed in mg/24 hours) patch 21 mg  21 mg Transdermal Daily Milagros Loll, MD      . Derrill Memo ON 10/12/2015] predniSONE (DELTASONE) tablet 40 mg  40 mg Oral Q breakfast Milagros Loll, MD       Current Outpatient Prescriptions  Medication Sig Dispense Refill  . acetaminophen (TYLENOL) 500 MG tablet Take 1,000 mg by mouth every 6 (six) hours as needed. For pain    . cetirizine (ZYRTEC) 10 MG tablet Take 10 mg by mouth daily as needed for allergies.    Marland Kitchen dextromethorphan-guaiFENesin (ROBITUSSIN-DM) 10-100 MG/5ML liquid Take 10 mLs by mouth every 4 (four) hours as needed for cough.    Marland Kitchen ibuprofen (ADVIL,MOTRIN) 200 MG tablet Take 200 mg by mouth every 6 (six) hours as needed for  moderate pain.    . traMADol (ULTRAM) 50 MG tablet Take 1 tablet (50 mg total) by mouth every 6 (six) hours as needed. (Patient not taking: Reported on 10/11/2015) 20 tablet 0    Allergies: Allergies as of 10/11/2015 - Review Complete 10/11/2015  Allergen Reaction Noted  . Penicillins Swelling 10/22/2011   History reviewed. No pertinent past medical history. Past Surgical History  Procedure Laterality Date  . Femur surgery     No family history on file. Social History   Social History  . Marital Status: Single    Spouse Name: N/A  . Number of Children: N/A  . Years of Education: N/A   Occupational History  . Not on file.   Social History Main Topics  . Smoking status: Current Every Day Smoker -- 0.50 packs/day    Types: Cigarettes  . Smokeless tobacco: Not on file  . Alcohol Use: Yes  . Drug Use: Not on file  . Sexual  Activity: Not on file   Other Topics Concern  . Not on file   Social History Narrative    Review of Systems: Pertinent items are noted in HPI. Balance of 10 point ROS negative.  Physical Exam: Blood pressure 163/89, pulse 128, temperature 101 F (38.3 C), temperature source Oral, resp. rate 27, last menstrual period 10/08/2015, SpO2 94 %. Physical Exam  Constitutional: She is oriented to person, place, and time.  WD, WN.  Sitting on gurney, uncomfortable appearing, agitated, but NAD.  HENT:  Head: Normocephalic and atraumatic.  Oropharynx not visualized.  Eyes: EOM are normal. No scleral icterus.  Neck: Normal range of motion. Neck supple. No JVD present. No tracheal deviation present.  No cervical neck pain.  No pain with chin to chest. Palpable submandibular lymph nodes.  Cardiovascular: Normal heart sounds and intact distal pulses.   Tachycardic, regular rhythm.   Pulmonary/Chest: Effort normal. No stridor. No respiratory distress.  Decreased air movement in RML.  Inspiratory and expiratory wheezes appreciated diffusely. No crackles.  Abdominal: Soft. She exhibits no distension. There is no tenderness. There is no rebound and no guarding.  Musculoskeletal: She exhibits no edema.  Neurological: She is alert and oriented to person, place, and time.  Skin: Skin is warm and dry.     Lab results: Basic Metabolic Panel:  Recent Labs  10/11/15 1304  NA 140  K 4.2  CL 107  CO2 23  GLUCOSE 104*  BUN 8  CREATININE 0.39*  CALCIUM 9.6   Liver Function Tests: No results for input(s): AST, ALT, ALKPHOS, BILITOT, PROT, ALBUMIN in the last 72 hours. No results for input(s): LIPASE, AMYLASE in the last 72 hours. No results for input(s): AMMONIA in the last 72 hours. CBC:  Recent Labs  10/11/15 1304  WBC 10.0  NEUTROABS 8.8*  HGB 9.1*  HCT 31.0*  MCV 72.9*  PLT 353   Cardiac Enzymes: No results for input(s): CKTOTAL, CKMB, CKMBINDEX, TROPONINI in the last 72  hours. BNP: No results for input(s): PROBNP in the last 72 hours. D-Dimer:  Recent Labs  10/11/15 1309  DDIMER 0.37   CBG: No results for input(s): GLUCAP in the last 72 hours. Hemoglobin A1C: No results for input(s): HGBA1C in the last 72 hours. Fasting Lipid Panel: No results for input(s): CHOL, HDL, LDLCALC, TRIG, CHOLHDL, LDLDIRECT in the last 72 hours. Thyroid Function Tests: No results for input(s): TSH, T4TOTAL, FREET4, T3FREE, THYROIDAB in the last 72 hours. Anemia Panel: No results for input(s): VITAMINB12, FOLATE, FERRITIN, TIBC,  IRON, RETICCTPCT in the last 72 hours. Coagulation: No results for input(s): LABPROT, INR in the last 72 hours. Urine Drug Screen: Drugs of Abuse  No results found for: LABOPIA, COCAINSCRNUR, LABBENZ, AMPHETMU, THCU, LABBARB  Alcohol Level: No results for input(s): ETH in the last 72 hours. Urinalysis: No results for input(s): COLORURINE, LABSPEC, PHURINE, GLUCOSEU, HGBUR, BILIRUBINUR, KETONESUR, PROTEINUR, UROBILINOGEN, NITRITE, LEUKOCYTESUR in the last 72 hours.  Invalid input(s): APPERANCEUR Misc. Labs:   Imaging results:  Dg Chest 2 View  10/11/2015  CLINICAL DATA:  Shortness of breath and chest pain EXAM: CHEST  2 VIEW COMPARISON:  10/11/2015 FINDINGS: The heart size and vascular pattern are normal. There is bilateral perihilar bronchitic change, similar to prior study. Increased opacity right perihilar area mildly improved but persistent, with patchy infiltrative opacity seen in this area on the lateral view. IMPRESSION: Bronchitic change. Additionally, suspect right middle lobe infiltrate concerning for pneumonia. Followup PA and lateral chest X-ray is recommended in 3-4 weeks following trial of antibiotic therapy to ensure resolution and exclude underlying malignancy. Electronically Signed   By: Skipper Cliche M.D.   On: 10/11/2015 15:49   Dg Chest Port 1 View  10/11/2015  CLINICAL DATA:  SOB. Prior history of pneumonia.  History  smoking. EXAM: PORTABLE CHEST 1 VIEW COMPARISON:  None. FINDINGS: Examination is degraded due to patient body habitus, hypoventilation and portable technique. Normal cardiac silhouette and mediastinal contours given AP projection decreased lung volumes. Atherosclerotic plaque within the thoracic aorta. Potential developing airspace opacity the right mid lung. No pleural effusion or pneumothorax. No evidence of edema. No acute osseus abnormalities. IMPRESSION: Potential developing right mid lung pneumonia suspected on this hypoventilated portable examination. Further evaluation with a PA and lateral chest radiograph may be obtained as clinically indicated. Electronically Signed   By: Sandi Mariscal M.D.   On: 10/11/2015 13:49    Other results: EKG: sinus tachycardia.  Assessment & Plan by Problem: Principal Problem:   CAP (community acquired pneumonia) Active Problems:   Anemia  Ms. Coffey is a 45 yo female with current tobacco use, presenting with 1 day h/o SOB.   CAP: Patient presents with fever, SOB, muscle aches, and RML infiltrate on CXR.  Given focal infiltrate, CAP is most likely.  However, patient's reporting of symptoms also concerning for influenza.  As she has a long tobacco history, her presentation may also be worsened by early underlying COPD.  She also appears to have an anxiety component.  D-dimer was negative, so PE ruled out.  Initial troponin was negative, ECG only with sinus tach after nebulizer, and she is not complaining of chest pain.  Therefore, unlikely to be MI. [ ]  Flu - Tylenol - Duonebs q4h - Prednisone 40 mg x 5 days (3/4 - ?) - Levaquin 750 mg IV qday x 5 days total (3/4 - ?)  Microcytic Anemia: Hgb 9.1 and MCV low.  LMP ended a few days ago. [ ]  Fe and ferritin  FEN/GI: - Regular  DVT Ppx: Lovenox  Dispo: Disposition is deferred at this time, awaiting improvement of current medical problems. Anticipated discharge in approximately 1-2 day(s).   The patient  does not have a current PCP (Provider Default, MD) and does need an [facility] hospital follow-up appointment after discharge.  The patient does not have transportation limitations that hinder transportation to clinic appointments.  Signed: Iline Oven, MD, PhD 10/11/2015, 5:24 PM

## 2015-10-11 NOTE — ED Notes (Signed)
Pt reports that she woke up at 730 this morning with mild CP. Pt then took Tussin DM and reports shortly after the pain increased and she felt increasingly short of breath. Pt reports it has progressively gotten worse throughout the day. Pt alert x4. No swelling to airway. Pt speaking in full sentences.

## 2015-10-11 NOTE — ED Provider Notes (Signed)
CSN: [id number]     Arrival date & time 10/11/15  1254 History   First MD Initiated Contact with Patient 10/11/15 1310     Chief Complaint  Patient presents with  . Shortness of Breath      The history is provided by the patient. No language interpreter was used.    Susan Harper is a 45 y.o. female who presents to the Emergency Department complaining of SOB, chest tightness.  Symptoms started this morning when she awoke when she felt some mild central chest tightness and shortness of breath. She took a Tussin DM and about an hour later she had significant worsening shortness of breath. She reports dry cough, hot feeling and a cold feeling at the same time. No lower extremity swelling or pain. No fevers. She is a smoker. She has no past medical history. Symptoms are severe, constant, worsening.   History reviewed. No pertinent past medical history. Past Surgical History  Procedure Laterality Date  . Femur surgery     No family history on file. Social History  Substance Use Topics  . Smoking status: Current Every Day Smoker -- 0.50 packs/day    Types: Cigarettes  . Smokeless tobacco: None  . Alcohol Use: Yes   OB History    No data available     Review of Systems  All other systems reviewed and are negative.     Allergies  Penicillins  Home Medications   Prior to Admission medications   Medication Sig Start Date End Date Taking? Authorizing Provider  acetaminophen (TYLENOL) 500 MG tablet Take 1,000 mg by mouth every 6 (six) hours as needed. For pain   Yes Historical Provider, MD  cetirizine (ZYRTEC) 10 MG tablet Take 10 mg by mouth daily as needed for allergies.   Yes Historical Provider, MD  dextromethorphan-guaiFENesin (ROBITUSSIN-DM) 10-100 MG/5ML liquid Take 10 mLs by mouth every 4 (four) hours as needed for cough.   Yes Historical Provider, MD  ibuprofen (ADVIL,MOTRIN) 200 MG tablet Take 200 mg by mouth every 6 (six) hours as needed for moderate pain.   Yes  Historical Provider, MD  traMADol (ULTRAM) 50 MG tablet Take 1 tablet (50 mg total) by mouth every 6 (six) hours as needed. Patient not taking: Reported on 10/11/2015 09/08/15   Lajean Saver, MD   BP 148/86 mmHg  Pulse 120  Temp(Src) 101 F (38.3 C) (Oral)  Resp 22  SpO2 94%  LMP 10/08/2015 (Exact Date) Physical Exam  Constitutional: She is oriented to person, place, and time. She appears well-developed and well-nourished.  HENT:  Head: Normocephalic and atraumatic.  Cardiovascular: Regular rhythm.   No murmur heard. Tachycardic  Pulmonary/Chest: No respiratory distress.  Tachypnea with end expiratory wheezes bilaterally  Abdominal: Soft. There is no tenderness. There is no rebound and no guarding.  Musculoskeletal: She exhibits no edema or tenderness.  Neurological: She is alert and oriented to person, place, and time.  Skin: Skin is warm and dry.  Psychiatric: She has a normal mood and affect. Her behavior is normal.  Nursing note and vitals reviewed.   ED Course  Procedures (including critical care time) Labs Review Labs Reviewed  BASIC METABOLIC PANEL - Abnormal; Notable for the following:    Glucose, Bld 104 (*)    Creatinine, Ser 0.39 (*)    All other components within normal limits  CBC WITH DIFFERENTIAL/PLATELET - Abnormal; Notable for the following:    Hemoglobin 9.1 (*)    HCT 31.0 (*)  MCV 72.9 (*)    MCH 21.4 (*)    MCHC 29.4 (*)    RDW 17.7 (*)    Neutro Abs 8.8 (*)    Lymphs Abs 0.6 (*)    All other components within normal limits  D-DIMER, QUANTITATIVE (NOT AT [facility])  I-STAT TROPOININ, ED  I-STAT CG4 LACTIC ACID, ED    Imaging Review Dg Chest 2 View  10/11/2015  CLINICAL DATA:  Shortness of breath and chest pain EXAM: CHEST  2 VIEW COMPARISON:  10/11/2015 FINDINGS: The heart size and vascular pattern are normal. There is bilateral perihilar bronchitic change, similar to prior study. Increased opacity right perihilar area mildly improved but persistent,  with patchy infiltrative opacity seen in this area on the lateral view. IMPRESSION: Bronchitic change. Additionally, suspect right middle lobe infiltrate concerning for pneumonia. Followup PA and lateral chest X-ray is recommended in 3-4 weeks following trial of antibiotic therapy to ensure resolution and exclude underlying malignancy. Electronically Signed   By: Skipper Cliche M.D.   On: 10/11/2015 15:49   Dg Chest Port 1 View  10/11/2015  CLINICAL DATA:  SOB. Prior history of pneumonia.  History smoking. EXAM: PORTABLE CHEST 1 VIEW COMPARISON:  None. FINDINGS: Examination is degraded due to patient body habitus, hypoventilation and portable technique. Normal cardiac silhouette and mediastinal contours given AP projection decreased lung volumes. Atherosclerotic plaque within the thoracic aorta. Potential developing airspace opacity the right mid lung. No pleural effusion or pneumothorax. No evidence of edema. No acute osseus abnormalities. IMPRESSION: Potential developing right mid lung pneumonia suspected on this hypoventilated portable examination. Further evaluation with a PA and lateral chest radiograph may be obtained as clinically indicated. Electronically Signed   By: Sandi Mariscal M.D.   On: 10/11/2015 13:49   I have personally reviewed and evaluated these images and lab results as part of my medical decision-making.   EKG Interpretation   Date/Time:  Saturday October 11 2015 13:01:02 EST Ventricular Rate:  115 PR Interval:  152 QRS Duration: 83 QT Interval:  320 QTC Calculation: 443 R Axis:   11 Text Interpretation:  Sinus tachycardia Confirmed by Hazle Coca [phone]) on  10/11/2015 3:32:45 PM      MDM   Final diagnoses:  None  Patient here for evaluation of chest pain and shortness of breath. Patient with tachycardia and tachypnea on initial examination with end expiratory wheezes. Following the treatment with albuterol her wheezes resolved and she felt improved. She does have persistent  tachycardia on repeat evaluation. Chest x-ray is concerning for pneumonia, we will treat for community acquired pneumonia with antibiotics. She was given a dose of steroids for potential reactive airway disease as well given her history of smoking and wheezing on exam. Plan to admit for further treatment given her hypoxia, tachycardia, tachypnea.    Quintella Reichert, MD 10/11/15 1606

## 2015-10-11 NOTE — ED Notes (Signed)
Patient transported to X-ray 

## 2015-10-11 NOTE — ED Notes (Signed)
Myself and Sarah, RN undressed patient, placed in gown, on monitor, continuous pulse oximetry and blood pressure cuff

## 2015-10-12 ENCOUNTER — Encounter (HOSPITAL_COMMUNITY): Payer: Self-pay | Admitting: *Deleted

## 2015-10-12 DIAGNOSIS — J1100 Influenza due to unidentified influenza virus with unspecified type of pneumonia: Secondary | ICD-10-CM

## 2015-10-12 LAB — CBC
HCT: 26.8 % — ABNORMAL LOW (ref 36.0–46.0)
Hemoglobin: 8 g/dL — ABNORMAL LOW (ref 12.0–15.0)
MCH: 21.5 pg — AB (ref 26.0–34.0)
MCHC: 29.9 g/dL — AB (ref 30.0–36.0)
MCV: 72 fL — ABNORMAL LOW (ref 78.0–100.0)
PLATELETS: 332 10*3/uL (ref 150–400)
RBC: 3.72 MIL/uL — ABNORMAL LOW (ref 3.87–5.11)
RDW: 18.1 % — AB (ref 11.5–15.5)
WBC: 7.3 10*3/uL (ref 4.0–10.5)

## 2015-10-12 MED ORDER — FERROUS SULFATE 325 (65 FE) MG PO TABS
325.0000 mg | ORAL_TABLET | Freq: Every day | ORAL | Status: DC
  Administered 2015-10-12: 325 mg via ORAL
  Filled 2015-10-12: qty 1

## 2015-10-12 MED ORDER — IPRATROPIUM-ALBUTEROL 0.5-2.5 (3) MG/3ML IN SOLN: 3.0000 mL | Freq: Four times a day (QID) | RESPIRATORY_TRACT | Status: DC | PRN

## 2015-10-12 MED ORDER — PNEUMOCOCCAL VAC POLYVALENT 25 MCG/0.5ML IJ INJ
0.5000 mL | INJECTION | INTRAMUSCULAR | Status: AC
  Administered 2015-10-12: 0.5 mL via INTRAMUSCULAR
  Filled 2015-10-12: qty 0.5

## 2015-10-12 MED ORDER — FERROUS SULFATE 325 (65 FE) MG PO TABS: 325.0000 mg | ORAL_TABLET | Freq: Every day | ORAL | Status: DC

## 2015-10-12 MED ORDER — INFLUENZA VAC SPLIT QUAD 0.5 ML IM SUSY
0.5000 mL | PREFILLED_SYRINGE | INTRAMUSCULAR | Status: AC
  Administered 2015-10-12: 0.5 mL via INTRAMUSCULAR

## 2015-10-12 MED ORDER — PREDNISONE 20 MG PO TABS: 40.0000 mg | ORAL_TABLET | Freq: Every day | ORAL | Status: DC

## 2015-10-12 MED ORDER — OSELTAMIVIR PHOSPHATE 75 MG PO CAPS
75.0000 mg | ORAL_CAPSULE | Freq: Every day | ORAL | Status: DC
Start: 2015-10-12 — End: 2015-10-12
  Administered 2015-10-12: 75 mg via ORAL
  Filled 2015-10-12: qty 1

## 2015-10-12 MED ORDER — LEVOFLOXACIN 750 MG PO TABS: 750.0000 mg | ORAL_TABLET | Freq: Every day | ORAL | Status: DC

## 2015-10-12 MED ORDER — PNEUMOCOCCAL VAC POLYVALENT 25 MCG/0.5ML IJ INJ
0.5000 mL | INJECTION | INTRAMUSCULAR | Status: DC
  Filled 2015-10-12: qty 0.5

## 2015-10-12 MED ORDER — OSELTAMIVIR PHOSPHATE 75 MG PO CAPS: 75.0000 mg | ORAL_CAPSULE | Freq: Every day | ORAL | Status: DC

## 2015-10-12 MED ORDER — SODIUM CHLORIDE 0.9 % IV SOLN
510.0000 mg | Freq: Once | INTRAVENOUS | Status: AC
  Administered 2015-10-12: 510 mg via INTRAVENOUS
  Filled 2015-10-12: qty 17

## 2015-10-12 NOTE — Progress Notes (Signed)
SATURATION QUALIFICATIONS: (This note is used to comply with regulatory documentation for home oxygen)  Patient Saturations on Room Air at Rest = 97%  Patient Saturations on Room Air while Ambulating = 97-99%    Please briefly explain why patient needs home oxygen:

## 2015-10-12 NOTE — Progress Notes (Signed)
Nsg Discharge Note  Admit Date:  10/11/2015 Discharge date: 10/12/2015   Despina Hidden to be D/C'd Home per MD order.  AVS completed.  Copy for chart, and copy for patient signed, and dated. Patient/caregiver able to verbalize understanding.  Discharge Medication:   Medication List    STOP taking these medications        traMADol 50 MG tablet  Commonly known as:  ULTRAM      TAKE these medications        acetaminophen 500 MG tablet  Commonly known as:  TYLENOL  Take 1,000 mg by mouth every 6 (six) hours as needed. For pain     cetirizine 10 MG tablet  Commonly known as:  ZYRTEC  Take 10 mg by mouth daily as needed for allergies.     dextromethorphan-guaiFENesin 10-100 MG/5ML liquid  Commonly known as:  ROBITUSSIN-DM  Take 10 mLs by mouth every 4 (four) hours as needed for cough.     ferrous sulfate 325 (65 FE) MG tablet  Take 1 tablet (325 mg total) by mouth daily with breakfast.     ibuprofen 200 MG tablet  Commonly known as:  ADVIL,MOTRIN  Take 200 mg by mouth every 6 (six) hours as needed for moderate pain.     levofloxacin 750 MG tablet  Commonly known as:  LEVAQUIN  Take 1 tablet (750 mg total) by mouth daily.     oseltamivir 75 MG capsule  Commonly known as:  TAMIFLU  Take 1 capsule (75 mg total) by mouth daily.     predniSONE 20 MG tablet  Commonly known as:  DELTASONE  Take 2 tablets (40 mg total) by mouth daily with breakfast.        Discharge Assessment: Filed Vitals:   10/11/15 2129 10/12/15 0610  BP: 130/50 133/62  Pulse: 117 96  Temp: 99.5 F (37.5 C) 98 F (36.7 C)  Resp: 18 18   Skin clean, dry and intact without evidence of skin break down, no evidence of skin tears noted. IV catheter discontinued intact. Site without signs and symptoms of complications - no redness or edema noted at insertion site, patient denies c/o pain - only slight tenderness at site.  Dressing with slight pressure applied.  D/c Instructions-Education: Discharge  instructions given to patient/family with verbalized understanding. D/c education completed with patient/family including follow up instructions, medication list, d/c activities limitations if indicated, with other d/c instructions as indicated by MD - patient able to verbalize understanding, all questions fully answered. Patient instructed to return to ED, call 911, or call MD for any changes in condition.  Patient escorted via [place], and D/C home via private auto.  Eulalah Rupert Margaretha Sheffield, RN 10/12/2015 2:17 PM

## 2015-10-12 NOTE — Discharge Summary (Signed)
Name: Susan Harper MRN: [id number] DOB: [birth year] 45 y.o. PCP: Provider Default, MD  Date of Admission: 10/11/2015 12:56 PM Date of Discharge: 10/12/2015 Attending Physician: Sid Falcon, MD  Discharge Diagnosis: 1. CAP and Influenza   Principal Problem:   CAP (community acquired pneumonia) Active Problems:   Iron deficiency anemia   Influenza with pneumonia  Discharge Medications:   Medication List    STOP taking these medications        traMADol 50 MG tablet  Commonly known as:  ULTRAM      TAKE these medications        acetaminophen 500 MG tablet  Commonly known as:  TYLENOL  Take 1,000 mg by mouth every 6 (six) hours as needed. For pain     cetirizine 10 MG tablet  Commonly known as:  ZYRTEC  Take 10 mg by mouth daily as needed for allergies.     dextromethorphan-guaiFENesin 10-100 MG/5ML liquid  Commonly known as:  ROBITUSSIN-DM  Take 10 mLs by mouth every 4 (four) hours as needed for cough.     ferrous sulfate 325 (65 FE) MG tablet  Take 1 tablet (325 mg total) by mouth daily with breakfast.     ibuprofen 200 MG tablet  Commonly known as:  ADVIL,MOTRIN  Take 200 mg by mouth every 6 (six) hours as needed for moderate pain.     levofloxacin 750 MG tablet  Commonly known as:  LEVAQUIN  Take 1 tablet (750 mg total) by mouth daily.     oseltamivir 75 MG capsule  Commonly known as:  TAMIFLU  Take 1 capsule (75 mg total) by mouth daily.     predniSONE 20 MG tablet  Commonly known as:  DELTASONE  Take 2 tablets (40 mg total) by mouth daily with breakfast.        Disposition and follow-up:   Ms.Susan Harper was discharged from [facility] in Good condition.  At the hospital follow up visit please address:  1.  Dyspnea, completion of antibiotics and steroids, smoking cessation  2.  Labs / imaging needed at time of follow-up: PFTs, CXR 4-6 weeks post discharge  3.  Pending labs/ test needing follow-up: none  Follow-up  Appointments: Follow-up Information    Follow up with Susan Harper. Schedule an appointment as soon as possible for a visit in 2 weeks.   Why:  establish care   Contact information:   [address] [place] [id number]      Discharge Instructions: Discharge Instructions    Call MD for:  difficulty breathing, headache or visual disturbances    Complete by:  As directed      Call MD for:  extreme fatigue    Complete by:  As directed      Call MD for:  persistant dizziness or light-headedness    Complete by:  As directed      Call MD for:  temperature >100.4    Complete by:  As directed      Diet - low sodium heart healthy    Complete by:  As directed      Increase activity slowly    Complete by:  As directed            Consultations:    Procedures Performed:  Dg Chest 2 View  10/11/2015  CLINICAL DATA:  Shortness of breath and chest pain EXAM: CHEST  2 VIEW COMPARISON:  10/11/2015 FINDINGS: The  heart size and vascular pattern are normal. There is bilateral perihilar bronchitic change, similar to prior study. Increased opacity right perihilar area mildly improved but persistent, with patchy infiltrative opacity seen in this area on the lateral view. IMPRESSION: Bronchitic change. Additionally, suspect right middle lobe infiltrate concerning for pneumonia. Followup PA and lateral chest X-ray is recommended in 3-4 weeks following trial of antibiotic therapy to ensure resolution and exclude underlying malignancy. Electronically Signed   By: Skipper Cliche M.D.   On: 10/11/2015 15:49   Dg Chest Port 1 View  10/11/2015  CLINICAL DATA:  SOB. Prior history of pneumonia.  History smoking. EXAM: PORTABLE CHEST 1 VIEW COMPARISON:  None. FINDINGS: Examination is degraded due to patient body habitus, hypoventilation and portable technique. Normal cardiac silhouette and mediastinal contours given AP projection decreased lung volumes. Atherosclerotic  plaque within the thoracic aorta. Potential developing airspace opacity the right mid lung. No pleural effusion or pneumothorax. No evidence of edema. No acute osseus abnormalities. IMPRESSION: Potential developing right mid lung pneumonia suspected on this hypoventilated portable examination. Further evaluation with a PA and lateral chest radiograph may be obtained as clinically indicated. Electronically Signed   By: Sandi Mariscal M.D.   On: 10/11/2015 13:49    2D Echo:   Cardiac Cath:   Admission HPI: [name] is a 45 yo female with current tobacco use, presenting with 1 day h/o SOB. Patient reports feeling completely normal yesterday. This morning, she first noticed muscle aches, shortness of breath, and her "skin crawling." She then felt worsening chest tightness, heart racing, and anxiety. Nothing is making it better or worse. She denies chest pain. She has fever, chills, HA, runny nose, cough, postnasal drip, congestion, and ear pain. She denies weight gain, leg swelling, or sick contacts. She did not get a flu shot. She is not sure if the anxiety preceded the SOB or vice versa. She has no h/o asthma or COPD, though she is a current tobacco smoker (1/2 ppd x 20 years; max 1 ppd). She denies h/o HTN, HLD, MI, or CHF. She has never had an episode like this before. She has had PNA in the past, but last occurrence was >20 years ago.   PMH: none Allergies: PCN (eye swelling) FHx: No FHx of COPD. 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 She smokes 1/2 ppd. She drinks 1-2 beers once a week. She denies drugs.  In the ED, she was febrile to 38.3C. CXR noted RML infiltrate. She was given Levaquin IV, Duoneb, and IV Solumedrol. D-dimer was negative.   Hospital Course by problem list: Principal Problem:   CAP (community acquired pneumonia) Active Problems:   Iron deficiency anemia   Influenza with pneumonia   CAP and Influenza:  Patient's flu PCR was positive in addition to her RML infiltrate on CXR.  She was started on Levofloxacin and Tamiflu.  Due to her chronic tobacco use, a component of COPD was suspected.  She was given additional Duoneb treatment, as well as Prednisone.  She was discharged on Levofloxacin 750 mg PO daily for 5 day total course, Prednisone 40 mg daily for 5 day total course, and Tamiflu 75 mg BID for 5 days.  At follow up, please arrange for patient to have PFTs as well as CXR to document clearance of infiltrate.  Iron Deficiency Anemia: Patient presented with microcytic anemia with low iron and ferritin.  She was given IV infusion of Feraheme and  discharged with PO iron supplementation.  Discharge Vitals:   BP 133/62 mmHg  Pulse 96  Temp(Src) 98 F (36.7 C) (Oral)  Resp 18  Ht 5\' 5"  (1.651 m)  Wt 203 lb 8 oz (92.307 kg)  BMI 33.86 kg/m2  SpO2 98%  LMP 10/08/2015 (Exact Date)  Discharge Labs:  Results for orders placed or performed during the hospital encounter of 10/11/15 (from the past 24 hour(s))  Basic metabolic panel     Status: Abnormal   Collection Time: 10/11/15  1:04 PM  Result Value Ref Range   Sodium 140 135 - 145 mmol/L   Potassium 4.2 3.5 - 5.1 mmol/L   Chloride 107 101 - 111 mmol/L   CO2 23 22 - 32 mmol/L   Glucose, Bld 104 (H) 65 - 99 mg/dL   BUN 8 6 - 20 mg/dL   Creatinine, Ser 0.39 (L) 0.44 - 1.00 mg/dL   Calcium 9.6 8.9 - 10.3 mg/dL   GFR calc non Af Amer >60 >60 mL/min   GFR calc Af Amer >60 >60 mL/min   Anion gap 10 5 - 15  CBC with Differential     Status: Abnormal   Collection Time: 10/11/15  1:04 PM  Result Value Ref Range   WBC 10.0 4.0 - 10.5 K/uL   RBC 4.25 3.87 - 5.11 MIL/uL   Hemoglobin 9.1 (L) 12.0 - 15.0 g/dL   HCT 31.0 (L) 36.0 - 46.0 %   MCV 72.9 (L) 78.0 - 100.0 fL   MCH 21.4 (L) 26.0 - 34.0 pg   MCHC 29.4 (L) 30.0 - 36.0 g/dL   RDW 17.7 (H) 11.5 - 15.5 %   Platelets 353 150 - 400 K/uL   Neutrophils Relative % 88 %   Lymphocytes Relative 6  %   Monocytes Relative 4 %   Eosinophils Relative 2 %   Basophils Relative 0 %   Neutro Abs 8.8 (H) 1.7 - 7.7 K/uL   Lymphs Abs 0.6 (L) 0.7 - 4.0 K/uL   Monocytes Absolute 0.4 0.1 - 1.0 K/uL   Eosinophils Absolute 0.2 0.0 - 0.7 K/uL   Basophils Absolute 0.0 0.0 - 0.1 K/uL  D-dimer, quantitative     Status: None   Collection Time: 10/11/15  1:09 PM  Result Value Ref Range   D-Dimer, Quant 0.37 0.00 - 0.50 ug/mL-FEU  I-stat troponin, ED     Status: None   Collection Time: 10/11/15  1:31 PM  Result Value Ref Range   Troponin i, poc 0.00 0.00 - 0.08 ng/mL   Comment 3          I-Stat CG4 Lactic Acid, ED     Status: None   Collection Time: 10/11/15  5:10 PM  Result Value Ref Range   Lactic Acid, Venous 0.67 0.5 - 2.0 mmol/L  Influenza panel by PCR (type A & B, H1N1)     Status: Abnormal   Collection Time: 10/11/15  5:19 PM  Result Value Ref Range   Influenza A By PCR POSITIVE (A) NEGATIVE   Influenza B By PCR NEGATIVE NEGATIVE   H1N1 flu by pcr NOT DETECTED NOT DETECTED  Ferritin     Status: Abnormal   Collection Time: 10/11/15  6:35 PM  Result Value Ref Range   Ferritin 10 (L) 11 - 307 ng/mL  Iron and TIBC     Status: Abnormal   Collection Time: 10/11/15  6:35 PM  Result Value Ref Range   Iron 13 (L) 28 - 170 ug/dL  TIBC 549 (H) 250 - 450 ug/dL   Saturation Ratios 2 (L) 10.4 - 31.8 %   UIBC 536 ug/dL  CBC     Status: Abnormal   Collection Time: 10/12/15  6:04 AM  Result Value Ref Range   WBC 7.3 4.0 - 10.5 K/uL   RBC 3.72 (L) 3.87 - 5.11 MIL/uL   Hemoglobin 8.0 (L) 12.0 - 15.0 g/dL   HCT 26.8 (L) 36.0 - 46.0 %   MCV 72.0 (L) 78.0 - 100.0 fL   MCH 21.5 (L) 26.0 - 34.0 pg   MCHC 29.9 (L) 30.0 - 36.0 g/dL   RDW 18.1 (H) 11.5 - 15.5 %   Platelets 332 150 - 400 K/uL    Signed: Iline Oven, MD 10/12/2015, 10:17 AM    Services Ordered on Discharge: none Equipment Ordered on Discharge: none

## 2015-10-12 NOTE — Progress Notes (Signed)
Subjective: NAEON.  She feels much better this morning than yesterday with improved breathing.  She denies fever.  Objective: Vital signs in last 24 hours: Filed Vitals:   10/11/15 2010 10/11/15 2129 10/12/15 0610 10/12/15 0956  BP:  130/50 133/62   Pulse: 110 117 96   Temp:  99.5 F (37.5 C) 98 F (36.7 C)   TempSrc:  Oral Oral   Resp: 18 18 18    Height:      Weight:      SpO2: 96% 98% 93% 98%   Weight change:   Intake/Output Summary (Last 24 hours) at 10/12/15 1008 Last data filed at 10/12/15 0600  Gross per 24 hour  Intake   2100 ml  Output      0 ml  Net   2100 ml   Physical Exam  Constitutional: She is oriented to person, place, and time and well-developed, well-nourished, and in no distress. No distress.  HENT:  Head: Normocephalic and atraumatic.  Eyes: EOM are normal. No scleral icterus.  Neck: No JVD present. No tracheal deviation present.  Cardiovascular: Regular rhythm, normal heart sounds and intact distal pulses.   Tachycardic.  Pulmonary/Chest: Effort normal. No stridor. No respiratory distress. She has no wheezes.  No wheezes appreciated.  No crackles. Improved air movement in RML.  Abdominal: Soft. She exhibits no distension. There is no tenderness. There is no rebound and no guarding.  Musculoskeletal: She exhibits no edema.  Neurological: She is alert and oriented to person, place, and time.  Skin: Skin is dry. She is not diaphoretic.    Lab Results: Basic Metabolic Panel:  Recent Labs Lab 10/11/15 1304  NA 140  K 4.2  CL 107  CO2 23  GLUCOSE 104*  BUN 8  CREATININE 0.39*  CALCIUM 9.6   Liver Function Tests: No results for input(s): AST, ALT, ALKPHOS, BILITOT, PROT, ALBUMIN in the last 168 hours. No results for input(s): LIPASE, AMYLASE in the last 168 hours. No results for input(s): AMMONIA in the last 168 hours. CBC:  Recent Labs Lab 10/11/15 1304 10/12/15 0604  WBC 10.0 7.3  NEUTROABS 8.8*  --   HGB 9.1* 8.0*  HCT 31.0*  26.8*  MCV 72.9* 72.0*  PLT 353 332   Cardiac Enzymes: No results for input(s): CKTOTAL, CKMB, CKMBINDEX, TROPONINI in the last 168 hours. BNP: No results for input(s): PROBNP in the last 168 hours. D-Dimer:  Recent Labs Lab 10/11/15 1309  DDIMER 0.37   CBG: No results for input(s): GLUCAP in the last 168 hours. Hemoglobin A1C: No results for input(s): HGBA1C in the last 168 hours. Fasting Lipid Panel: No results for input(s): CHOL, HDL, LDLCALC, TRIG, CHOLHDL, LDLDIRECT in the last 168 hours. Thyroid Function Tests: No results for input(s): TSH, T4TOTAL, FREET4, T3FREE, THYROIDAB in the last 168 hours. Coagulation: No results for input(s): LABPROT, INR in the last 168 hours. Anemia Panel:  Recent Labs Lab 10/11/15 1835  FERRITIN 10*  TIBC 549*  IRON 13*   Urine Drug Screen: Drugs of Abuse  No results found for: LABOPIA, COCAINSCRNUR, LABBENZ, AMPHETMU, THCU, LABBARB  Alcohol Level: No results for input(s): ETH in the last 168 hours. Urinalysis: No results for input(s): COLORURINE, LABSPEC, PHURINE, GLUCOSEU, HGBUR, BILIRUBINUR, KETONESUR, PROTEINUR, UROBILINOGEN, NITRITE, LEUKOCYTESUR in the last 168 hours.  Invalid input(s): APPERANCEUR Misc. Labs:   Micro Results: No results found for this or any previous visit (from the past 240 hour(s)). Studies/Results: Dg Chest 2 View  10/11/2015  CLINICAL DATA:  Shortness of breath and chest pain EXAM: CHEST  2 VIEW COMPARISON:  10/11/2015 FINDINGS: The heart size and vascular pattern are normal. There is bilateral perihilar bronchitic change, similar to prior study. Increased opacity right perihilar area mildly improved but persistent, with patchy infiltrative opacity seen in this area on the lateral view. IMPRESSION: Bronchitic change. Additionally, suspect right middle lobe infiltrate concerning for pneumonia. Followup PA and lateral chest X-ray is recommended in 3-4 weeks following trial of antibiotic therapy to ensure  resolution and exclude underlying malignancy. Electronically Signed   By: Skipper Cliche M.D.   On: 10/11/2015 15:49   Dg Chest Port 1 View  10/11/2015  CLINICAL DATA:  SOB. Prior history of pneumonia.  History smoking. EXAM: PORTABLE CHEST 1 VIEW COMPARISON:  None. FINDINGS: Examination is degraded due to patient body habitus, hypoventilation and portable technique. Normal cardiac silhouette and mediastinal contours given AP projection decreased lung volumes. Atherosclerotic plaque within the thoracic aorta. Potential developing airspace opacity the right mid lung. No pleural effusion or pneumothorax. No evidence of edema. No acute osseus abnormalities. IMPRESSION: Potential developing right mid lung pneumonia suspected on this hypoventilated portable examination. Further evaluation with a PA and lateral chest radiograph may be obtained as clinically indicated. Electronically Signed   By: Sandi Mariscal M.D.   On: 10/11/2015 13:49   Medications: I have reviewed the patient's current medications. Scheduled Meds: . enoxaparin (LOVENOX) injection  40 mg Subcutaneous Q24H  . ferrous sulfate  325 mg Oral Q breakfast  . ferumoxytol  510 mg Intravenous Once  . ipratropium-albuterol  3 mL Nebulization QID  . levofloxacin  750 mg Oral Daily  . loratadine  10 mg Oral Daily  . nicotine  21 mg Transdermal Daily  . oseltamivir  75 mg Oral Daily  . predniSONE  40 mg Oral Q breakfast   Continuous Infusions:  PRN Meds:.acetaminophen, albuterol, ibuprofen Assessment/Plan: Principal Problem:   CAP (community acquired pneumonia) Active Problems:   Iron deficiency anemia   Influenza with pneumonia  Ms. Mikol is a 45 yo female with current tobacco use, presenting with 1 day h/o SOB.   CAP and Influenza: Patient presents with fever, SOB, muscle aches, and RML infiltrate on CXR. Given focal infiltrate, CAP is most likely. Flue PCR also positive. As she has a long tobacco history, her presentation may also be  worsened by early underlying COPD. She also appears to have an anxiety component. D-dimer was negative, so PE ruled out. Initial troponin was negative, ECG only with sinus tach after nebulizer, and she is not complaining of chest pain. Therefore, unlikely to be MI. - Tamiflu 75 mg BID x5 days - Tylenol - Duonebs q4h - Prednisone 40 mg x 5 days (3/4 - 3/8) - Levaquin 750 mg IV qday x 5 days total (3/4 - 3/8)  Iron Deficiency Anemia: Hgb 9.1 and MCV low. LMP ended a few days ago.  Iron and ferritin low.  Will start on PO replacement and give one IV infusion before discharge.  FEN/GI: - Regular  DVT Ppx: Lovenox  Dispo: Disposition is deferred at this time, awaiting improvement of current medical problems.  Anticipated discharge in approximately 1-2 day(s).   The patient does not have a current PCP (Provider Default, MD) and does need an [facility] hospital follow-up appointment after discharge.  The patient does not have transportation limitations that hinder transportation to clinic appointments.  .Services Needed at time of discharge: Y = Yes, Blank = No PT:   OT:  RN:   Equipment:   Other:       Iline Oven, MD 10/12/2015, 10:08 AM

## 2015-10-12 NOTE — Discharge Instructions (Signed)
1. Take Tamiflu 1 tab twice daily until complete. 2. Take Levofloxacin 1 tab once daily until complete. 3. Take Prednisone 2 tabs once daily until complete. 4. Follow up with [facility] to establish care.   Community-Acquired Pneumonia, Adult Pneumonia is an infection of the lungs. One type of pneumonia can happen while a person is in a hospital. A different type can happen when a person is not in a hospital (community-acquired pneumonia). It is easy for this kind to spread from person to person. It can spread to you if you breathe near an infected person who coughs or sneezes. Some symptoms include:  A dry cough.  A wet (productive) cough.  Fever.  Sweating.  Chest pain. HOME CARE  Take over-the-counter and prescription medicines only as told by your doctor.  Only take cough medicine if you are losing sleep.  If you were prescribed an antibiotic medicine, take it as told by your doctor. Do not stop taking the antibiotic even if you start to feel better.  Sleep with your head and neck raised (elevated). You can do this by putting a few pillows under your head, or you can sleep in a recliner.  Do not use tobacco products. These include cigarettes, chewing tobacco, and e-cigarettes. If you need help quitting, ask your doctor.  Drink enough water to keep your pee (urine) clear or pale yellow. A shot (vaccine) can help prevent pneumonia. Shots are often suggested for:  People older than 47 years of age.  People older than 47 years of age:  Who are having cancer treatment.  Who have long-term (chronic) lung disease.  Who have problems with their body's defense system (immune system). You may also prevent pneumonia if you take these actions:  Get the flu (influenza) shot every year.  Go to the dentist as often as told.  Wash your hands often. If soap and water are not available, use hand sanitizer. GET HELP IF:  You have a fever.  You lose sleep because  your cough medicine does not help. GET HELP RIGHT AWAY IF:  You are short of breath and it gets worse.  You have more chest pain.  Your sickness gets worse. This is very serious if:  You are an older adult.  Your body's defense system is weak.  You cough up blood.   This information is not intended to replace advice given to you by your health care provider. Make sure you discuss any questions you have with your health care provider.   Document Released: 01/12/2008 Document Revised: 04/16/2015 Document Reviewed: 11/20/2014 Elsevier Interactive Patient Education [employer].

## 2016-12-28 IMAGING — CR DG ELBOW COMPLETE 3+V*R*
4 series · 4 of 4 positions shown · non-contrast
Comparison: None.

CLINICAL DATA: Eight week history of elbow pain.  No known injury.

EXAM:
RIGHT ELBOW - COMPLETE 3+ VIEW

[x elbow joint ap right]
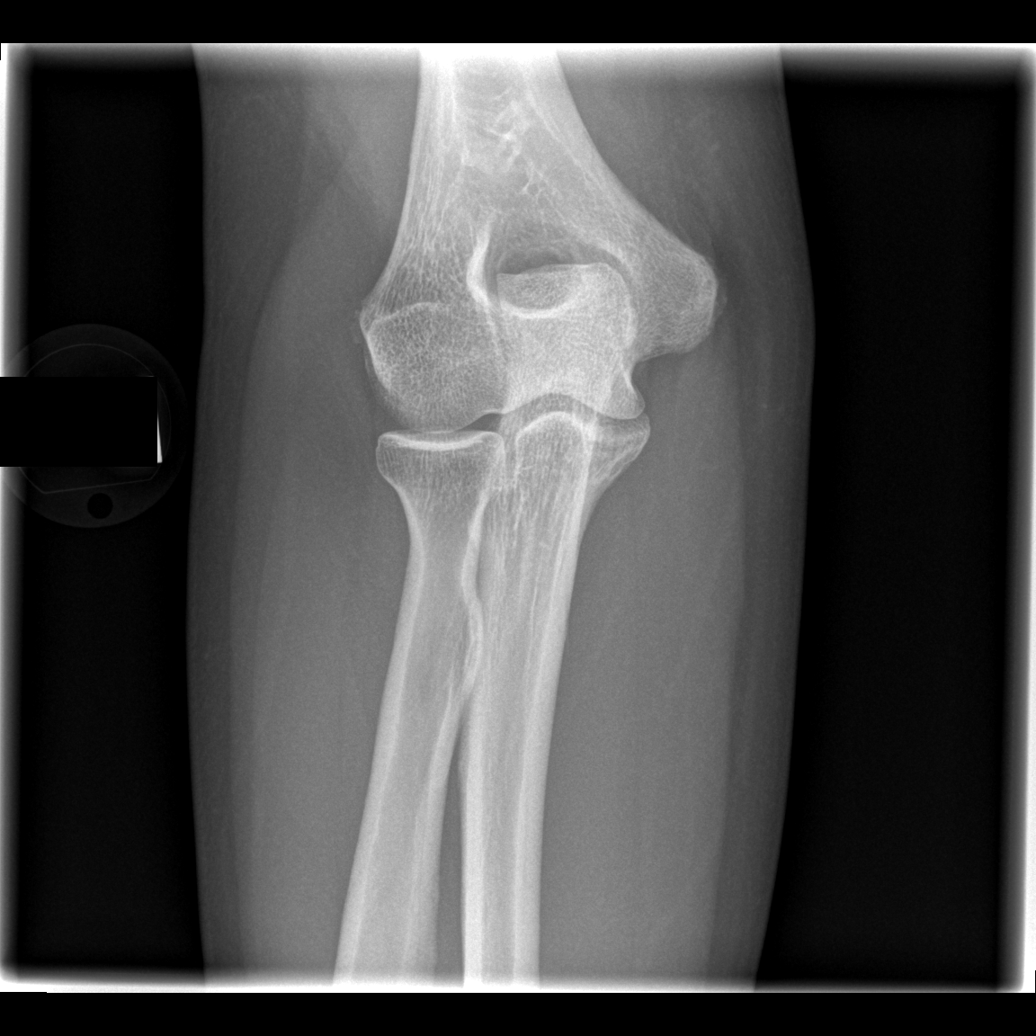

[x elbow joint obl. right (1 of 2)]
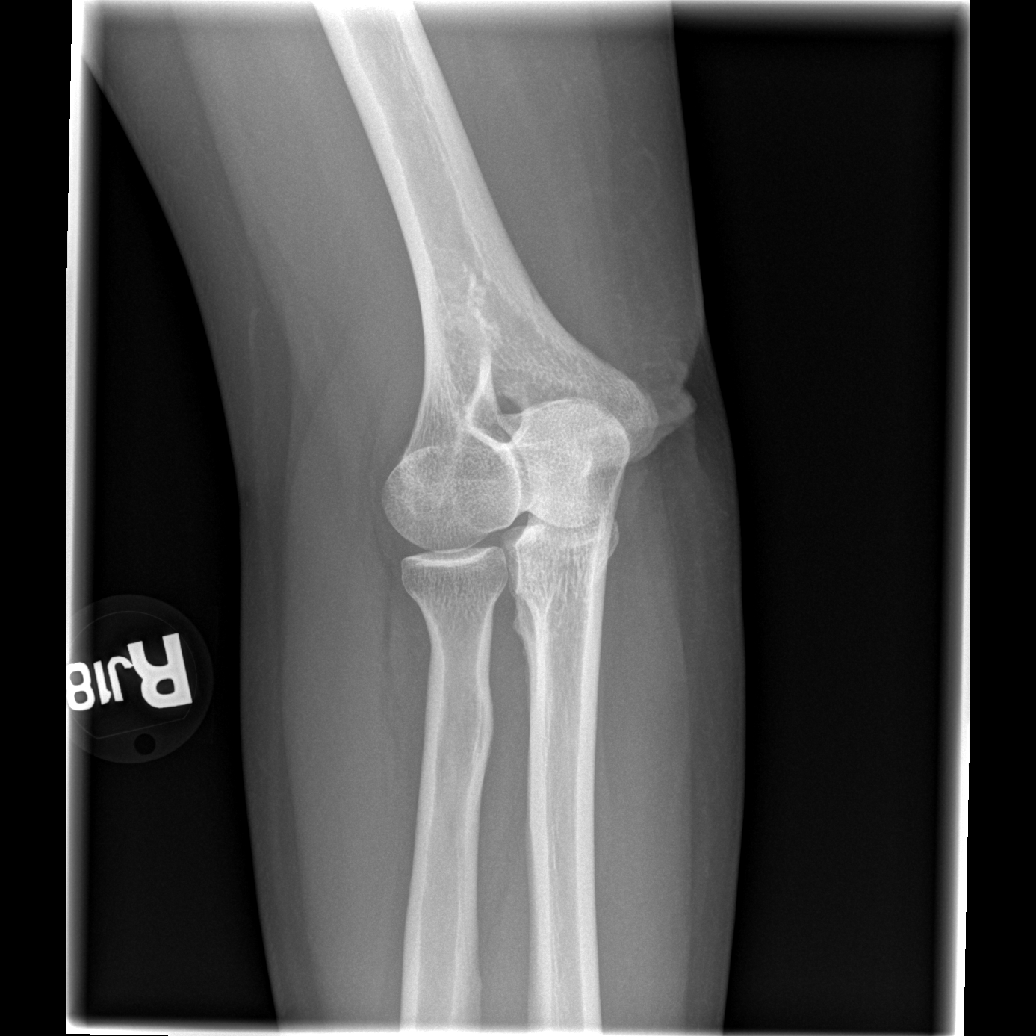

[x elbow joint obl. right (2 of 2)]
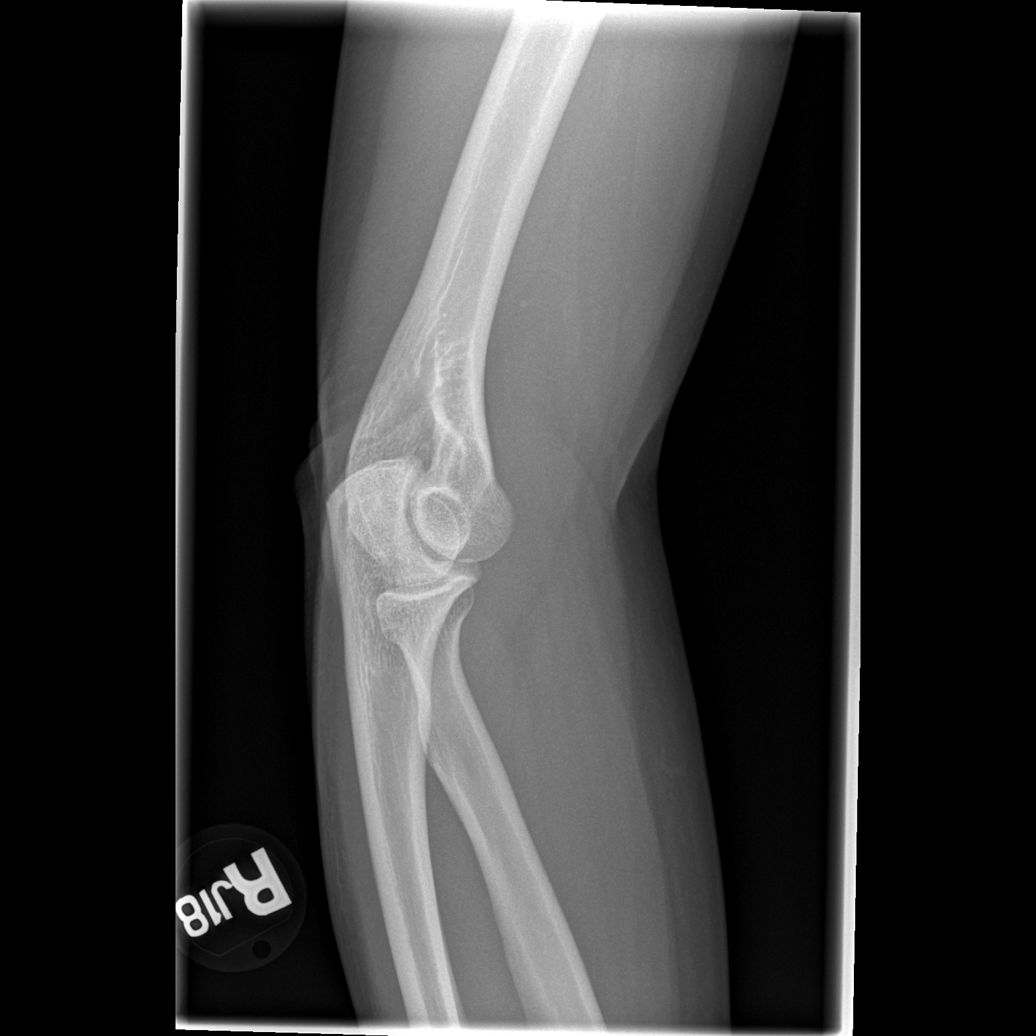

[x elbow joint lat right]
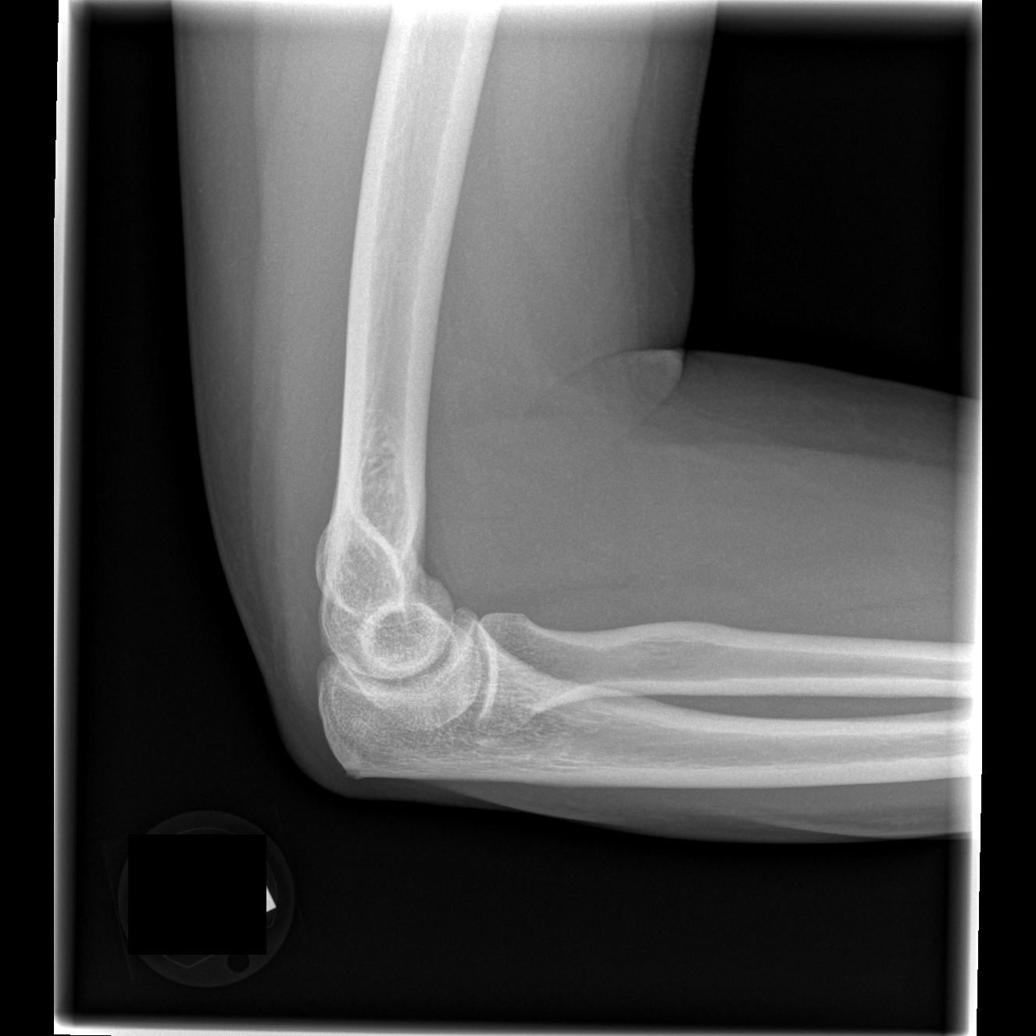

[4 of 4 positions shown; findings below may reference images not displayed]

FINDINGS: The joint spaces are maintained. No acute fracture is identified. No
osteochondral abnormality or joint effusion.
IMPRESSION: No acute bony findings or joint effusion.

## 2017-01-30 IMAGING — DX DG CHEST 2V
2 series · 2 of 2 positions shown · non-contrast
Comparison: 10/11/2015

CLINICAL DATA: Shortness of breath and chest pain

EXAM:
CHEST  2 VIEW

[chest pa]
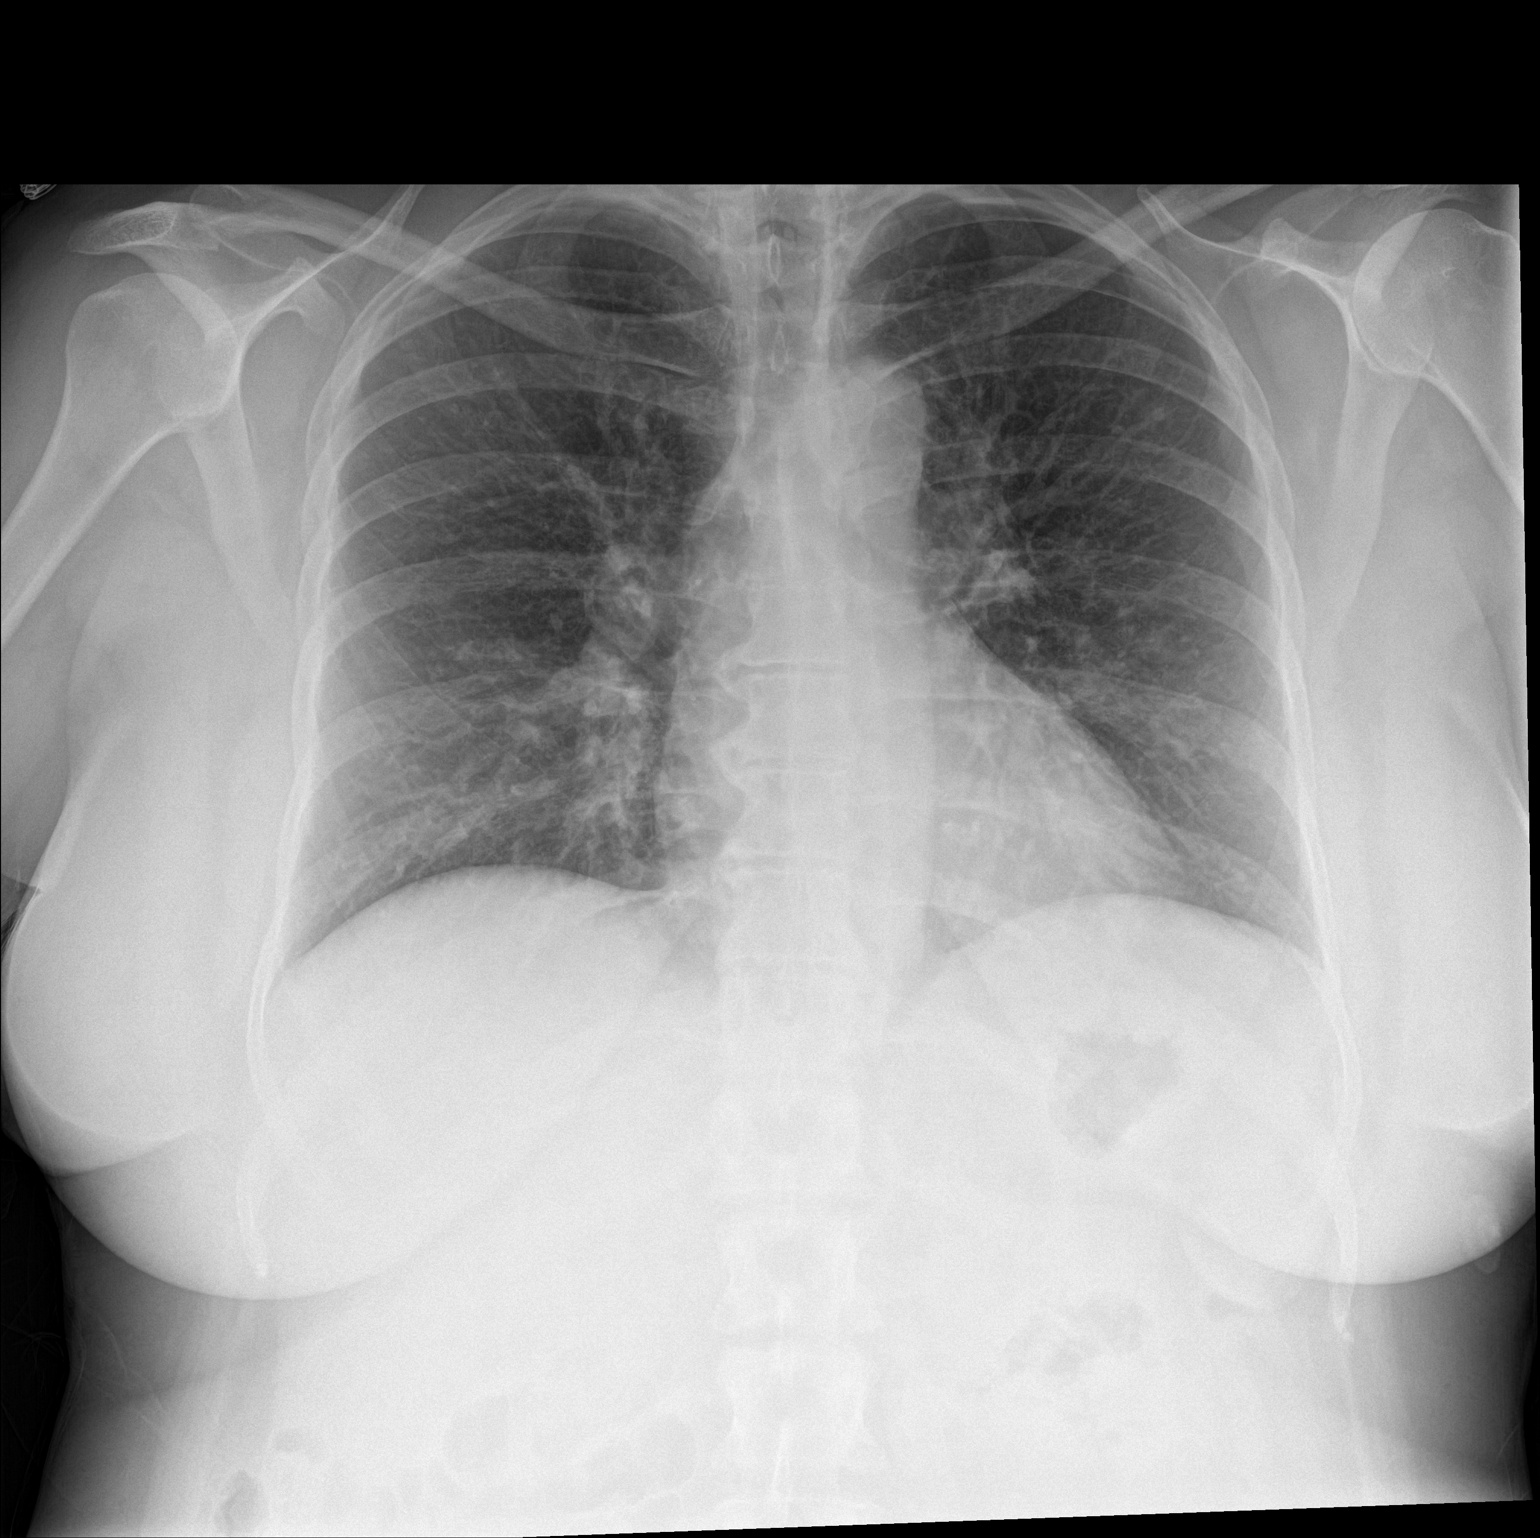

[chest lat]
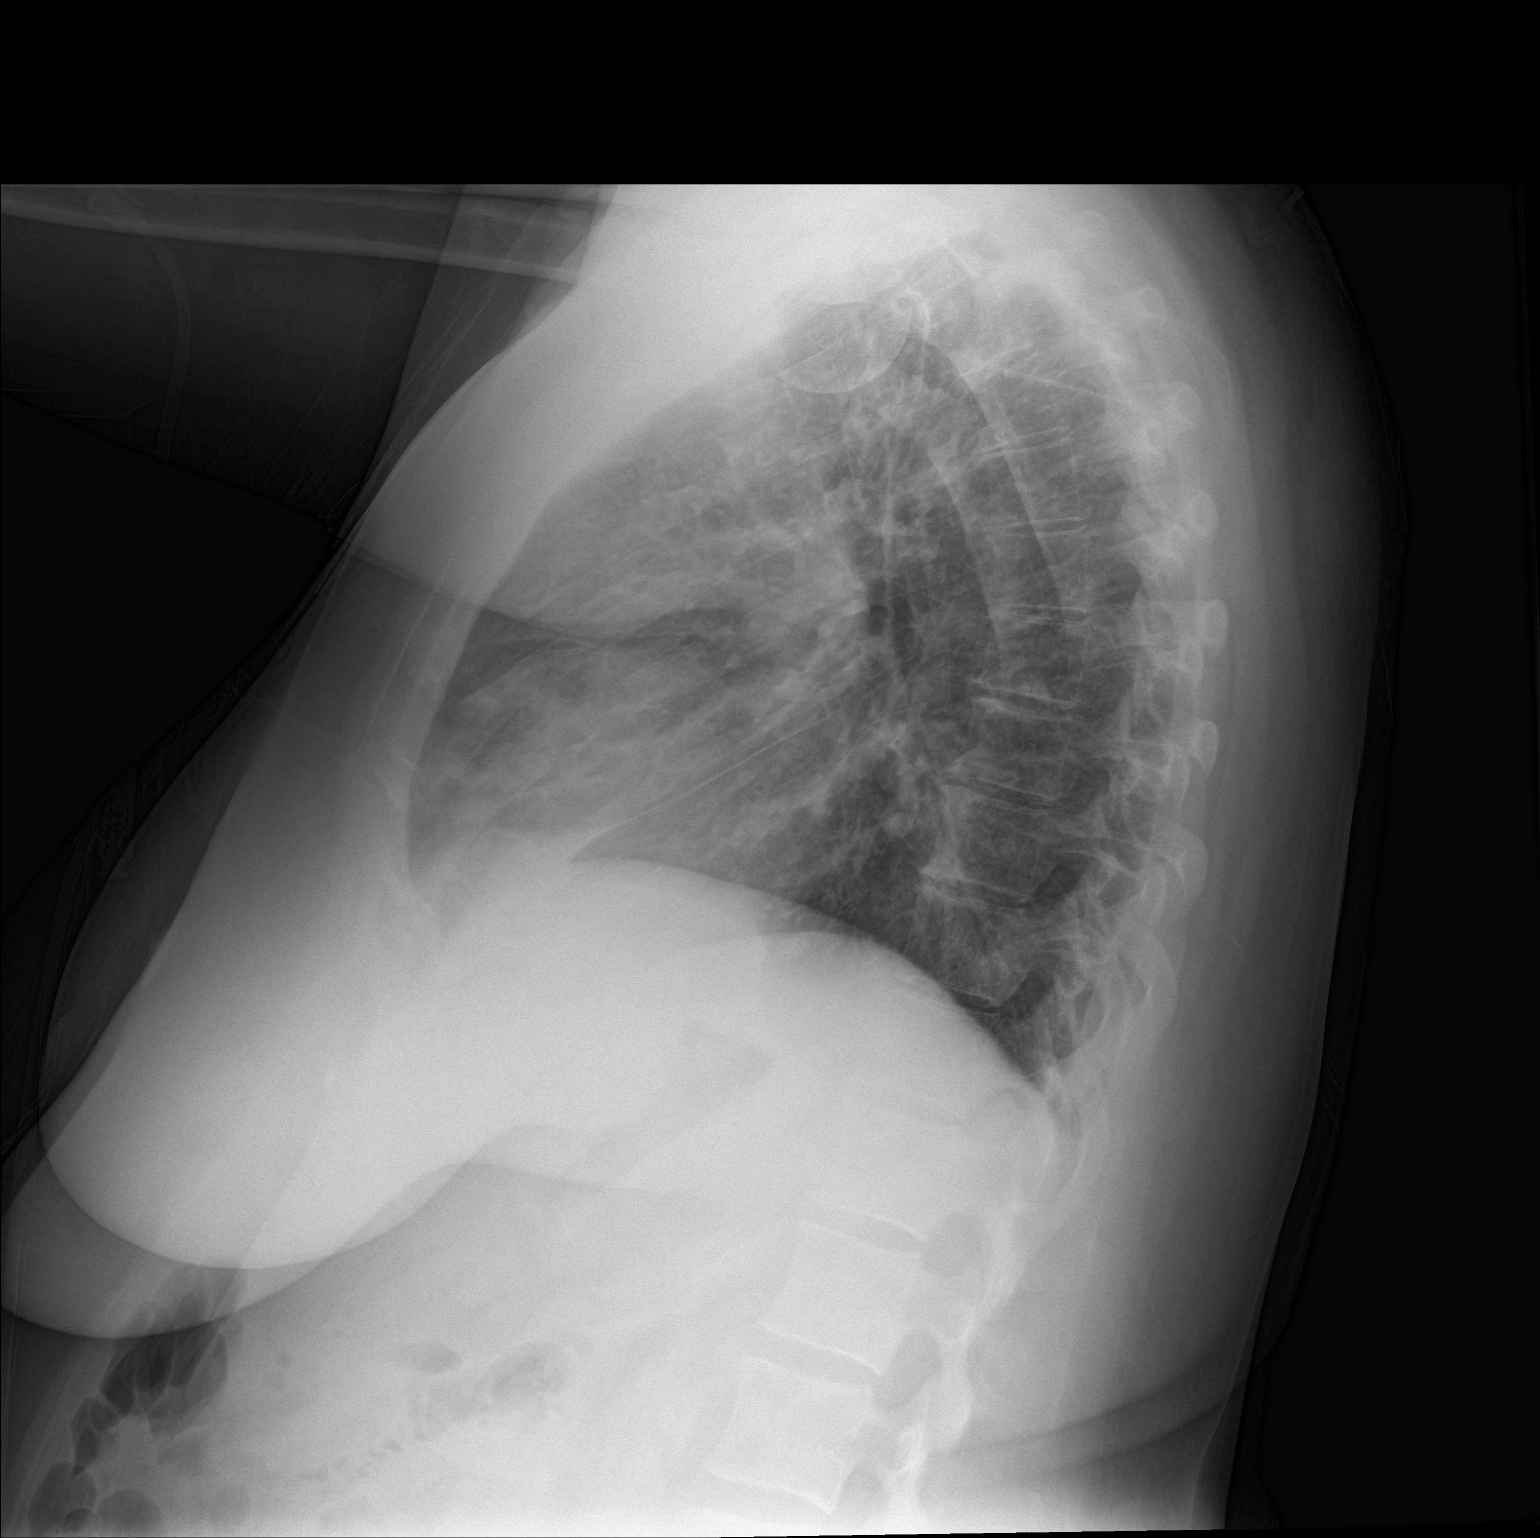

[2 of 2 positions shown; findings below may reference images not displayed]

FINDINGS: The heart size and vascular pattern are normal. There is bilateral
perihilar bronchitic change, similar to prior study. Increased
opacity right perihilar area mildly improved but persistent, with
patchy infiltrative opacity seen in this area on the lateral view.
IMPRESSION: Bronchitic change. Additionally, suspect right middle lobe
infiltrate concerning for pneumonia. Followup PA and lateral chest
X-ray is recommended in 3-4 weeks following trial of antibiotic
therapy to ensure resolution and exclude underlying malignancy.

## 2017-01-30 IMAGING — CR DG CHEST 1V PORT
1 series · 1 of 1 positions shown · non-contrast
Comparison: None.

CLINICAL DATA: SOB. Prior history of pneumonia.  History smoking.

EXAM:
PORTABLE CHEST 1 VIEW

[AP]
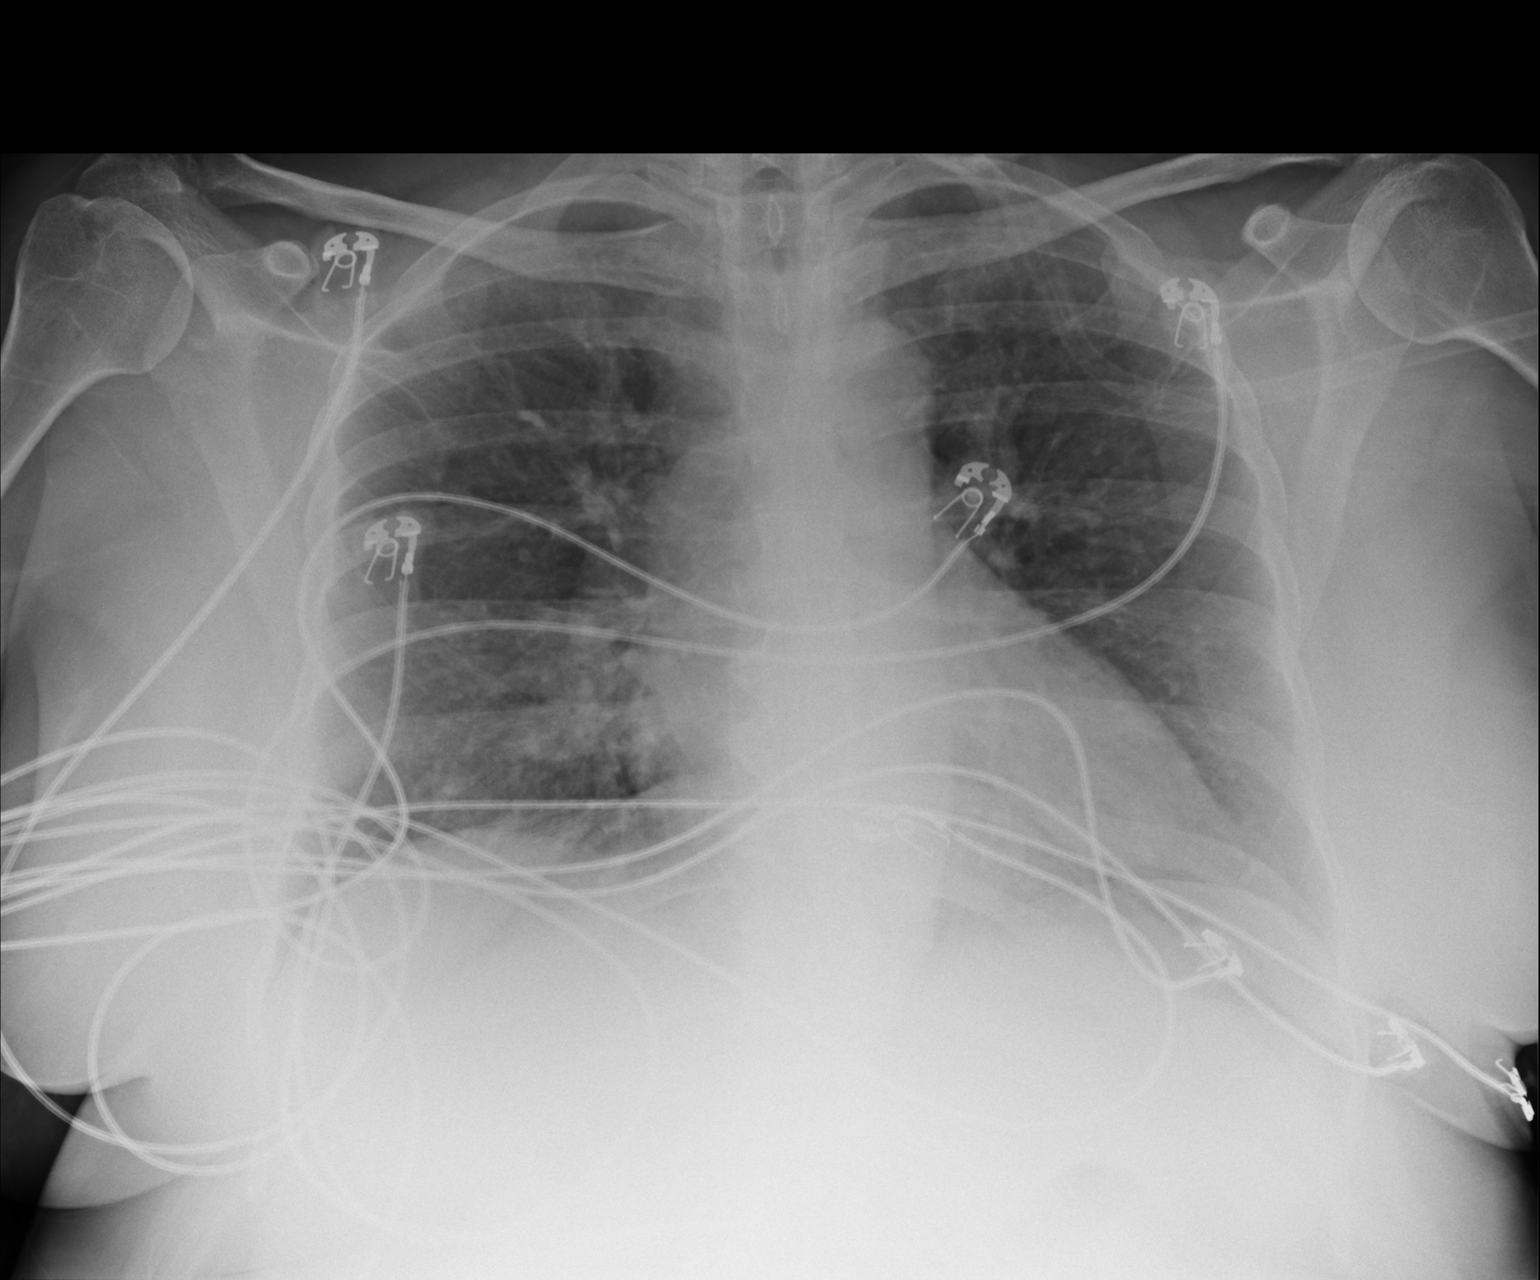

[1 of 1 positions shown; findings below may reference images not displayed]

FINDINGS: Examination is degraded due to patient body habitus, hypoventilation
and portable technique.

Normal cardiac silhouette and mediastinal contours given AP
projection decreased lung volumes. Atherosclerotic plaque within the
thoracic aorta. Potential developing airspace opacity the right mid
lung. No pleural effusion or pneumothorax. No evidence of edema. No
acute osseus abnormalities.
IMPRESSION: Potential developing right mid lung pneumonia suspected on this
hypoventilated portable examination. Further evaluation with a PA
and lateral chest radiograph may be obtained as clinically
indicated.

## 2017-04-06 ENCOUNTER — Encounter (HOSPITAL_COMMUNITY): Payer: Self-pay | Admitting: Emergency Medicine

## 2017-04-06 DIAGNOSIS — Z5321 Procedure and treatment not carried out due to patient leaving prior to being seen by health care provider: Secondary | ICD-10-CM | POA: Insufficient documentation

## 2017-04-06 LAB — COMPREHENSIVE METABOLIC PANEL
ALBUMIN: 4.5 g/dL (ref 3.5–5.0)
ALT: 211 U/L — AB (ref 14–54)
AST: 176 U/L — AB (ref 15–41)
Alkaline Phosphatase: 125 U/L (ref 38–126)
Anion gap: 10 (ref 5–15)
BILIRUBIN TOTAL: 0.8 mg/dL (ref 0.3–1.2)
BUN: 16 mg/dL (ref 6–20)
CHLORIDE: 105 mmol/L (ref 101–111)
CO2: 22 mmol/L (ref 22–32)
CREATININE: 0.5 mg/dL (ref 0.44–1.00)
Calcium: 9.5 mg/dL (ref 8.9–10.3)
GFR calc Af Amer: >60 mL/min (ref >60)
Glucose, Bld: 102 mg/dL — ABNORMAL HIGH (ref 65–99)
Potassium: 3.9 mmol/L (ref 3.5–5.1)
Sodium: 137 mmol/L (ref 135–145)
Total Protein: 7.8 g/dL (ref 6.5–8.1)

## 2017-04-06 LAB — CBC
HCT: 41.6 % (ref 36.0–46.0)
HEMOGLOBIN: 14.3 g/dL (ref 12.0–15.0)
MCH: 31 pg (ref 26.0–34.0)
MCHC: 34.4 g/dL (ref 30.0–36.0)
MCV: 90.2 fL (ref 78.0–100.0)
PLATELETS: 287 10*3/uL (ref 150–400)
RBC: 4.61 MIL/uL (ref 3.87–5.11)
RDW: 14.1 % (ref 11.5–15.5)
WBC: 12.5 10*3/uL — AB (ref 4.0–10.5)

## 2017-04-06 LAB — I-STAT BETA HCG BLOOD, ED (MC, WL, AP ONLY): I-stat hCG, quantitative: 6.9 m[IU]/mL — ABNORMAL HIGH (ref <5)

## 2017-04-06 LAB — LIPASE, BLOOD: LIPASE: 23 U/L (ref 11–51)

## 2017-04-06 NOTE — ED Notes (Signed)
Pt had drawn in triage for labs:  Red Gold Lavender Blue Lt green Dark green

## 2017-04-06 NOTE — ED Notes (Signed)
Called pt's name, but no response from the waiting room.  

## 2017-04-06 NOTE — ED Triage Notes (Signed)
Pt states that since Sunday she has had fatigue, anxiety, emesis and bilateral arm soreness. Alert and oriented.

## 2017-04-07 ENCOUNTER — Emergency Department (HOSPITAL_COMMUNITY)
Admission: EM | Admit: 2017-04-07 | Discharge: 2017-04-07 | Discharge status: Against Medical Advice | Payer: Medicaid Other | Attending: Emergency Medicine | Admitting: Emergency Medicine

## 2017-04-07 NOTE — ED Notes (Signed)
No answer third call for pt.

## 2019-07-19 ENCOUNTER — Telehealth: Payer: Self-pay

## 2019-07-19 NOTE — Telephone Encounter (Signed)
Referral received. Records reviewed. Called and spoke with Susan Harper. Appointment has been scheduled for 12/16 at 0900. Directions to the cancer center given. Instructed to wear a mask and she may have one person accompany her.

## 2019-07-20 ENCOUNTER — Telehealth: Payer: Self-pay

## 2019-07-20 NOTE — Telephone Encounter (Signed)
Call received from [facility] regarding payment and being self pay. Attempted to return call. No answer/no voicemail/no my chart.

## 2019-07-25 ENCOUNTER — Inpatient Hospital Stay: Payer: Self-pay | Attending: Obstetrics and Gynecology | Admitting: Obstetrics and Gynecology

## 2019-07-25 ENCOUNTER — Inpatient Hospital Stay: Payer: Self-pay

## 2019-07-25 ENCOUNTER — Encounter (INDEPENDENT_AMBULATORY_CARE_PROVIDER_SITE_OTHER): Payer: Self-pay

## 2019-07-25 ENCOUNTER — Other Ambulatory Visit
Admission: RE | Admit: 2019-07-25 | Discharge: 2019-07-25 | Disposition: A | Discharge status: Home / Self Care | Payer: Self-pay | Source: Ambulatory Visit | Attending: Obstetrics and Gynecology | Admitting: Obstetrics and Gynecology

## 2019-07-25 ENCOUNTER — Other Ambulatory Visit: Payer: Self-pay

## 2019-07-25 VITALS — BP 148/80 | HR 75 | Temp 97.6°F | Resp 16 | Ht 65.0 in | Wt 178.5 lb

## 2019-07-25 DIAGNOSIS — N939 Abnormal uterine and vaginal bleeding, unspecified: Secondary | ICD-10-CM | POA: Insufficient documentation

## 2019-07-25 DIAGNOSIS — N838 Other noninflammatory disorders of ovary, fallopian tube and broad ligament: Secondary | ICD-10-CM | POA: Insufficient documentation

## 2019-07-25 DIAGNOSIS — Z01812 Encounter for preprocedural laboratory examination: Secondary | ICD-10-CM | POA: Insufficient documentation

## 2019-07-25 DIAGNOSIS — Z20828 Contact with and (suspected) exposure to other viral communicable diseases: Secondary | ICD-10-CM | POA: Insufficient documentation

## 2019-07-25 LAB — SARS CORONAVIRUS 2 (TAT 6-24 HRS): SARS Coronavirus 2: NEGATIVE

## 2019-07-25 NOTE — Progress Notes (Signed)
Endometrial biopsy delivered to pathology.

## 2019-07-25 NOTE — Progress Notes (Signed)
Gynecologic Oncology Consult Visit   Referring Provider: Dr. Ouida Sills  Chief Complaint: left ovarian mass  Subjective:  Susan Harper is a 48 y.o. GP41 female who is seen in consultation from Dr. Ouida Sills for left ovarian mass.   Patient initially presented for 'lower abdominal bump'. At that time she had not seen a gyn in 10 years and was post menopausal for 2 years. She compalined of pelvic pressure and a bulging in her lower abdomen that was worsen with sitting and lying down, increased pressure with urination and defecation and some urinary leakage. Exam revealed bulky or enlarged uterus.   Pap- NILM, HR HPV Negative  US Pelvis Transvaginal: Uterus- anteverted Fibroids seen 1) fundal = 1.7 cm 2) mid = 2.5 cm 3) posterior = 2.3 cm Endometrium = 13.6 mm No free fluid seen  Lt Complex adnexal mass = 17.24 x 9.81 x 10.69 cm; complex nodule within = 6.70 x 5.96 x 7.52 cm Lt adnexal mass volume = 945.55 ml Left ovary not definitely seen  Right ovary appears within normal limits.  Wet prep + trichomonas  07/18/2019-  CA 125 13.5 LDH  131 AFP  6.0 HE4  56.3 Inhibin A < 3  ROMA 9.6-12% low risk  She presents today for evaluation. She is in considerable pain and using 4 tabs of hydrocodone/acetaminophen daily without relief. She works as a Freight forwarder.   Problem List: Patient Active Problem List   Diagnosis Date Noted  . Ovarian mass, left 07/25/2019  . Abnormal vaginal bleeding 07/25/2019  . Influenza with pneumonia 10/12/2015  . CAP (community acquired pneumonia) 10/11/2015  . Iron deficiency anemia 10/11/2015    Past Medical History: Past Medical History:  Diagnosis Date  . Anemia   . Hypertension   . Ovarian cancer ([place])   seasonal allergies  Past Surgical History: Past Surgical History:  Procedure Laterality Date  . FEMUR SURGERY      Past Gynecologic History:  Menarche: 12 Menstrual details: Lasts 7 days  She does have hot flashes Menses  regular: Postmenopausal Last Menstrual Period: 2 years ago History of OCP/HRT use: None currently History of Abnormal pap: No Last pap: per hpi Contraception: none Sexually active: not currently  OB History:  OB History  Gravida Para Term Preterm AB Living  3 1 1   1     SAB TAB Ectopic Multiple Live Births               # Outcome Date GA Lbr Len/2nd Weight Sex Delivery Anes PTL Lv  3 Gravida           2 AB           1 Term             Family History: Family History  Problem Relation Age of Onset  . Diabetes Mother   . Hypertension Father      Social History: Freight forwarder; divorced Social History   Socioeconomic History  . Marital status: Single    Spouse name: Not on file  . Number of children: Not on file  . Years of education: Not on file  . Highest education level: Not on file  Occupational History  . Not on file  Tobacco Use  . Smoking status: Current Every Day Smoker    Packs/day: 0.50    Years: 20.00    Pack years: 10.00    Types: Cigarettes  Substance and Sexual Activity  . Alcohol use: Yes  . Drug use: No  .  Sexual activity: Yes  Other Topics Concern  . Not on file  Social History Narrative  . Not on file   Social Determinants of Health   Financial Resource Strain:   . Difficulty of Paying Living Expenses: Not on file  Food Insecurity:   . Worried About Charity fundraiser in the Last Year: Not on file  . Ran Out of Food in the Last Year: Not on file  Transportation Needs:   . Lack of Transportation (Medical): Not on file  . Lack of Transportation (Non-Medical): Not on file  Physical Activity:   . Days of Exercise per Week: Not on file  . Minutes of Exercise per Session: Not on file  Stress:   . Feeling of Stress : Not on file  Social Connections:   . Frequency of Communication with Friends and Family: Not on file  . Frequency of Social Gatherings with Friends and Family: Not on file  . Attends Religious Services: Not on file  . Active  Member of Clubs or Organizations: Not on file  . Attends Archivist Meetings: Not on file  . Marital Status: Not on file  Intimate Partner Violence:   . Fear of Current or Ex-Partner: Not on file  . Emotionally Abused: Not on file  . Physically Abused: Not on file  . Sexually Abused: Not on file    Allergies: Allergies  Allergen Reactions  . Penicillins Swelling    Of the face    Current Medications: Current Outpatient Medications  Medication Sig Dispense Refill  . acetaminophen (TYLENOL) 500 MG tablet Take 1,000 mg by mouth every 6 (six) hours as needed. For pain    . cetirizine (ZYRTEC) 10 MG tablet Take 10 mg by mouth daily as needed for allergies.    Marland Kitchen HYDROcodone-acetaminophen (NORCO/VICODIN) 5-325 MG tablet 1 po q 8 hrs prn pain    . lisinopril-hydrochlorothiazide (ZESTORETIC) 10-12.5 MG tablet Take by mouth.    . Multiple Vitamin (MULTI-VITAMIN) tablet Take by mouth.     No current facility-administered medications for this visit.    Review of Systems General: positive for night sweats; negative for fevers, changes in weight  Skin: negative for changes in moles or sores or rash Eyes: negative for changes in vision HEENT: negative for change in hearing, tinnitus, voice changes Pulmonary: negative for dyspnea, orthopnea, productive cough, wheezing Cardiac: negative for palpitations, pain Gastrointestinal: negative for nausea, vomiting, constipation, diarrhea, hematemesis, hematochezia Genitourinary/Sexual: urinary frequency Ob/Gyn:  Positive for abnormal bleeding for last week, and pain lower abdomen  Musculoskeletal: negative for pain, joint pain, back pain Hematology: negative for easy bruising, abnormal bleeding Neurologic/Psych: negative for headaches, seizures, paralysis, weakness, numbness   Objective:  Physical Examination:  BP (!) 148/80 (BP Location: Right Arm, Patient Position: Sitting)   Pulse 75   Temp 97.6 F (36.4 C) (Tympanic)   Resp 16    Ht 5\' 5"  (1.651 m)   Wt 178 lb 8 oz (81 kg)   BMI 29.70 kg/m     ECOG Performance Status: 1 - Symptomatic but completely ambulatory  GENERAL: Patient is a well appearing female in no acute distress HEENT:  PERRL, neck supple with midline trachea. Thyroid without masses.  NODES:  No cervical, supraclavicular, axillary, or inguinal lymphadenopathy palpated.  LUNGS:  Clear to auscultation bilaterally.  Inspiratory wheezes bilaterally; no rhonchi. HEART:  Regular rate and rhythm.  ABDOMEN:  Soft, nondistended; no ascites; large mobile mass filling the right/mid pelvis and extending to the umbilicus  as least 12-14 cm-can not determine full extent that is tender to palpation. No RGR. No hepatosplenomegaly.  MSK:  No focal spinal tenderness to palpation. Full range of motion bilaterally.  EXTREMITIES:  No peripheral edema.   SKIN:  Clear with no obvious rashes or skin changes. No nail dyscrasia. NEURO:  Nonfocal. Well oriented.  Appropriate affect.  Pelvic: EGBUS: no lesions Cervix: no lesions, nontender, mobile Vagina: positive for bleeding from the os; no lesions, no discharge  Uterus: unable to determine size due to the mass. See procedure note Adnexa: large right pelvic mass, mobile; no left adnexal mass Rectovaginal: deferred  EMBX: The risks and benefits of the procedure were reviewed and informed consent obtained. Time out was performed. The patient received pre-procedure teaching and expressed understanding. The post-procedure instructions were reviewed with the patient and she expressed understanding. The patient does not have any barriers to learning.  Speculum placed in the vaginal vault and cervix identified. Cervix cleansed with Betadine and anterior lip grasped with a tenaculum. The os was entered with a small dilator and the pipelle was inserted to 7-8 cm. The specimen was obtained without difficult and appeared adequate. The tenaculum was removed and hemostasis  adequate. Post-procedure evaluation the patient was stable without complaints.  The risks and benefits of the procedure were reviewed and informed consent obtained. Time out was performed. The patient received pre-procedure teaching and expressed understanding. The post-procedure instructions were reviewed with the patient and she expressed understanding. The patient does not have any barriers to learning.  Post-procedure evaluation the patient was stable without complaints.   Lab Review Labs on site today: Inhibin B  Radiologic Imaging: Reviewed report; unable to review images    Assessment:  AIDELIZ MCPHETRIDGE is a 48 y.o. postmenopausal female diagnosed with symptomatic complex right ovarian mass (ROMA low risk score) and abnormal vaginal bleeding.   Significant pain due to ovarian mass.   Medical co-morbidities complicating care: smoker; Body mass index is 29.7 kg/m.Marland Kitchen  Plan:   Problem List Items Addressed This Visit      Other   Abnormal vaginal bleeding   Ovarian mass, left - Primary   Relevant Orders   Inhibin B   Surgical pathology     We discussed the differential. I suspect she may have an ovarian stromal tumor/granulosa cell tumor given the abnormal vaginal bleeding. She may also have an early stage ovarian cancer, tumor markers can be negative in 50% of early stage ovarian malignancies. We performed an endometrial biopsy today that we sent STAT. I recommended surgery and given the extent of her pain, I would like to facilitate her surgery. I do not think she has an advanced malignancy and surgery can be performed by Dr.  Ouida Sills and Dr. Leafy Ro. We hope to have results from the endometrial biopsy later today.    Suggested return to clinic will be based on her pathology results.   The patient's diagnosis, an outline of the further diagnostic and laboratory studies which will be required, the recommendation for surgery, and alternatives were discussed with her and  her accompanying family members.  All questions were answered to their satisfaction.  A total of 60 minutes were spent with the patient/family today; >50% was spent in education, counseling and coordination of care for pelvic exam.   Denia Mcvicar Gaetana Michaelis, MD    CC:  Boykin Nearing, MD [address] [facility] [place],  [place] [id number] [phone]

## 2019-07-25 NOTE — H&P (Signed)
Chief Complaint:    Susan Harper is a 48 y.o. female here for sign consents . 17 cm left ovrain mass - complex .  Seen by Dr Theora Gianotti today . She  Feels that it mass is low risk for cancer  And that I can proceed on with her surgery . Over the past 4days her pain has increased significantly .  Ca125, inhibin ,  AFP , LDH , HE4 all within normal limits   U/s from 07/19/2019: Uterus anteverted  Fibroids seen: 1)fundal=1.7cm 2)mid=2.5cm 3)posterior=2.3cm  Endometrium=13.55mm  No free fluid seen  Lt complex adnexal mass=17.24 x 9.81 x 10.69cm; complex nodule within=6.70 x 5.96 x 7.52cm  Lt adnexal mass volume=945.22ml  Lt ovary not definitely seen  Rt ovary appears wnl   Past Medical History:  has a past medical history of Seasonal allergies.  Past Surgical History:  has a past surgical history that includes right femur surgery. Family History: family history includes Diabetes in her mother; High blood pressure (Hypertension) in her father; Hyperlipidemia (Elevated cholesterol) in her father. Social History:  reports that she has been smoking cigarettes. She has been smoking about 0.50 packs per day. She has never used smokeless tobacco. She reports current alcohol use. She reports that she does not use drugs. OB/GYN History:          OB History    Gravida  3   Para  1   Term  1   Preterm      AB  2   Living  1     SAB  1   TAB  1   Ectopic      Molar      Multiple      Live Births  1          Allergies: is allergic to penicillins. Medications:  Current Outpatient Medications:  .  HYDROcodone-acetaminophen (NORCO) 5-325 mg tablet, 1 po q 8 hrs prn pain, Disp: 15 tablet, Rfl: 0 .  lisinopriL-hydrochlorothiazide (ZESTORETIC) 10-12.5 mg tablet, Take 1 tablet by mouth once daily, Disp: 30 tablet, Rfl: 11 .  multivitamin tablet, Take 1 tablet by mouth once daily, Disp: , Rfl:   Review of Systems: General:                      No  fatigue or weight loss Eyes:                           No vision changes Ears:                            No hearing difficulty Respiratory:                No cough or shortness of breath Pulmonary:                  No asthma or shortness of breath Cardiovascular:           No chest pain, palpitations, dyspnea on exertion Gastrointestinal:          No abdominal bloating, chronic diarrhea, constipations, masses, pain or hematochezia Genitourinary:             No hematuria, dysuria, abnormal vaginal discharge, pelvic pain, Menometrorrhagia Lymphatic:                   No swollen lymph nodes Musculoskeletal:  No muscle weakness Neurologic:                  No extremity weakness, syncope, seizure disorder Psychiatric:                  No history of depression, delusions or suicidal/homicidal ideation    Exam:      Vitals:   07/25/19 1052  BP: 136/80  Pulse:     Body mass index is 29.79 kg/m.  WDWN white/  female in NAD   Lungs: CTA  CV : RRR without murmur   Neck:  no thyromegaly Abdomen: soft ,central mass to 4 cm above SP ., normal active bowel sounds, +tender( diffuse), no rebound tenderness Pelvic: tanner stage 5 ,  External genitalia: vulva /labia no lesions Urethra: no prolapse Vagina:greenish d/c Wet mount : + trichomonas Cervix: no lesions,mildcervical motion tenderness  Uterus:18 week size , globular , uniform . Mild TTP . Ovaries not specifically palpated Adnexa:no mass, non-tender  Impression:   The encounter diagnosis was Mass of left ovary. Now symptomatic    Plan:   A detailed discussion with the pt regarding the role of surgery , ie TAH/ BSO .  She understands the need for surgery  Benefits and risks to surgery: The proposed benefit of the surgery has been discussed with the patient. The possible risks include, but are not limited to: organ injury to the bowel , bladder, ureters, and major blood vessels and nerves. There is  a possibility of additional surgeries resulting from these injuries. There is also the risk of blood transfusion and the need to receive blood products during or after the procedure which may rarely lead to HIV or Hepatitis C infection. There is a risk of developing a deep venous thrombosis or a pulmonary embolism . There is the possibility of wound infection and also anesthetic complications, even the rare possibility of death. The patient understands these risks and wishes to proceed. All questions have been answered and the consent has been signed. 50 minutes in coordination of care and discussion with pt .   Will get an Inhibin B level in case this is a granulosa tumor     Orders Placed This Encounter  Procedures  . Inhibin B - [facility]    Return in about 2 weeks (around 08/08/2019), or post op surgery on 07/27/2019- defore I leave for vacation.  Caroline Sauger, MD

## 2019-07-25 NOTE — Progress Notes (Signed)
New patient referred by Dr Ouida Sills for left ovarian mass.  Pt reports had bleeding last night, red in color, moderate amount.

## 2019-07-26 LAB — INHIBIN B: Inhibin B: 8.3 pg/mL

## 2019-07-26 LAB — SURGICAL PATHOLOGY

## 2019-07-26 MED ORDER — GENTAMICIN SULFATE 40 MG/ML IJ SOLN
5.0000 mg/kg | INTRAVENOUS | Status: AC
  Administered 2019-07-27: 08:00:00 410 mg via INTRAVENOUS
  Filled 2019-07-26: qty 10.25

## 2019-07-26 MED ORDER — CLINDAMYCIN PHOSPHATE 900 MG/50ML IV SOLN
900.0000 mg | INTRAVENOUS | Status: AC
  Administered 2019-07-27: 07:00:00 900 mg via INTRAVENOUS

## 2019-07-27 ENCOUNTER — Inpatient Hospital Stay: Payer: Self-pay | Admitting: Anesthesiology

## 2019-07-27 ENCOUNTER — Inpatient Hospital Stay
Admission: RE | Admit: 2019-07-27 | Discharge: 2019-07-28 | DRG: 743 | Disposition: A | Discharge status: Home / Self Care | Payer: Self-pay | Attending: Obstetrics and Gynecology | Admitting: Obstetrics and Gynecology

## 2019-07-27 ENCOUNTER — Other Ambulatory Visit: Payer: Self-pay

## 2019-07-27 ENCOUNTER — Encounter
Admission: RE | Disposition: A | Discharge status: Home / Self Care | Payer: Self-pay | Source: Home / Self Care | Attending: Obstetrics and Gynecology

## 2019-07-27 ENCOUNTER — Encounter: Payer: Self-pay | Admitting: Obstetrics and Gynecology

## 2019-07-27 DIAGNOSIS — Z880 Allergy status to penicillin: Secondary | ICD-10-CM

## 2019-07-27 DIAGNOSIS — F17210 Nicotine dependence, cigarettes, uncomplicated: Secondary | ICD-10-CM | POA: Diagnosis present

## 2019-07-27 DIAGNOSIS — Z9889 Other specified postprocedural states: Secondary | ICD-10-CM

## 2019-07-27 DIAGNOSIS — N83292 Other ovarian cyst, left side: Principal | ICD-10-CM | POA: Diagnosis present

## 2019-07-27 DIAGNOSIS — I10 Essential (primary) hypertension: Secondary | ICD-10-CM | POA: Diagnosis present

## 2019-07-27 HISTORY — PX: HYSTERECTOMY ABDOMINAL WITH SALPINGO-OOPHORECTOMY: SHX6792

## 2019-07-27 HISTORY — PX: LAPAROTOMY: SHX154

## 2019-07-27 LAB — CBC
HCT: 42.6 % (ref 36.0–46.0)
Hemoglobin: 14.3 g/dL (ref 12.0–15.0)
MCH: 33 pg (ref 26.0–34.0)
MCHC: 33.6 g/dL (ref 30.0–36.0)
MCV: 98.4 fL (ref 80.0–100.0)
Platelets: 246 10*3/uL (ref 150–400)
RBC: 4.33 MIL/uL (ref 3.87–5.11)
RDW: 12.9 % (ref 11.5–15.5)
WBC: 8.4 10*3/uL (ref 4.0–10.5)
nRBC: 0 % (ref 0.0–0.2)

## 2019-07-27 LAB — TYPE AND SCREEN
ABO/RH(D): O POS
Antibody Screen: NEGATIVE

## 2019-07-27 LAB — BASIC METABOLIC PANEL
Anion gap: 11 (ref 5–15)
BUN: 22 mg/dL — ABNORMAL HIGH (ref 6–20)
CO2: 23 mmol/L (ref 22–32)
Calcium: 10.1 mg/dL (ref 8.9–10.3)
Chloride: 102 mmol/L (ref 98–111)
Creatinine, Ser: 0.52 mg/dL (ref 0.44–1.00)
GFR calc Af Amer: >60 mL/min (ref >60)
GFR calc non Af Amer: >60 mL/min (ref >60)
Glucose, Bld: 109 mg/dL — ABNORMAL HIGH (ref 70–99)
Potassium: 4.1 mmol/L (ref 3.5–5.1)
Sodium: 136 mmol/L (ref 135–145)

## 2019-07-27 LAB — ABO/RH: ABO/RH(D): O POS

## 2019-07-27 LAB — POCT PREGNANCY, URINE: Preg Test, Ur: NEGATIVE

## 2019-07-27 SURGERY — HYSTERECTOMY, ABDOMINAL, WITH SALPINGO-OOPHORECTOMY
Anesthesia: General

## 2019-07-27 MED ORDER — SCOPOLAMINE 1 MG/3DAYS TD PT72
MEDICATED_PATCH | TRANSDERMAL | Status: DC | PRN
  Administered 2019-07-27: 1 via TRANSDERMAL

## 2019-07-27 MED ORDER — OXYCODONE HCL 5 MG/5ML PO SOLN: 5.0000 mg | Freq: Once | ORAL | Status: DC | PRN

## 2019-07-27 MED ORDER — BUPIVACAINE LIPOSOME 1.3 % IJ SUSP: 20.0000 mL | Freq: Once | INTRAMUSCULAR | Status: DC

## 2019-07-27 MED ORDER — SOD CITRATE-CITRIC ACID 500-334 MG/5ML PO SOLN
30.0000 mL | ORAL | Status: DC
  Filled 2019-07-27: qty 30

## 2019-07-27 MED ORDER — ACETAMINOPHEN 500 MG PO TABS
1000.0000 mg | ORAL_TABLET | ORAL | Status: AC
  Administered 2019-07-27: 07:00:00 1000 mg via ORAL

## 2019-07-27 MED ORDER — FENTANYL CITRATE (PF) 100 MCG/2ML IJ SOLN
INTRAMUSCULAR | Status: AC
  Filled 2019-07-27: qty 2

## 2019-07-27 MED ORDER — FAMOTIDINE 20 MG PO TABS
20.0000 mg | ORAL_TABLET | Freq: Once | ORAL | Status: AC
  Administered 2019-07-27: 20 mg via ORAL

## 2019-07-27 MED ORDER — GABAPENTIN 300 MG PO CAPS
300.0000 mg | ORAL_CAPSULE | Freq: Every day | ORAL | Status: DC
  Administered 2019-07-27: 300 mg via ORAL
  Filled 2019-07-27: qty 1

## 2019-07-27 MED ORDER — LIDOCAINE HCL (PF) 2 % IJ SOLN
INTRAMUSCULAR | Status: AC
  Filled 2019-07-27: qty 10

## 2019-07-27 MED ORDER — GLYCOPYRROLATE 0.2 MG/ML IJ SOLN
INTRAMUSCULAR | Status: DC | PRN
  Administered 2019-07-27: .2 mg via INTRAVENOUS

## 2019-07-27 MED ORDER — LIDOCAINE HCL (CARDIAC) PF 100 MG/5ML IV SOSY
PREFILLED_SYRINGE | INTRAVENOUS | Status: DC | PRN
  Administered 2019-07-27: 80 mg via INTRAVENOUS

## 2019-07-27 MED ORDER — PHENYLEPHRINE HCL (PRESSORS) 10 MG/ML IV SOLN
INTRAVENOUS | Status: DC | PRN
  Administered 2019-07-27 (×5): 100 ug via INTRAVENOUS

## 2019-07-27 MED ORDER — SIMETHICONE 80 MG PO CHEW
80.0000 mg | CHEWABLE_TABLET | Freq: Four times a day (QID) | ORAL | Status: DC | PRN
  Administered 2019-07-27 – 2019-07-28 (×4): 80 mg via ORAL
  Filled 2019-07-27 (×4): qty 1

## 2019-07-27 MED ORDER — SODIUM CHLORIDE (PF) 0.9 % IJ SOLN
INTRAMUSCULAR | Status: AC
  Filled 2019-07-27: qty 50

## 2019-07-27 MED ORDER — BUPIVACAINE HCL (PF) 0.5 % IJ SOLN
INTRAMUSCULAR | Status: AC
  Filled 2019-07-27: qty 30

## 2019-07-27 MED ORDER — HYDROCHLOROTHIAZIDE 12.5 MG PO CAPS
12.5000 mg | ORAL_CAPSULE | Freq: Every day | ORAL | Status: DC
  Administered 2019-07-27: 12.5 mg via ORAL
  Filled 2019-07-27 (×2): qty 1

## 2019-07-27 MED ORDER — FENTANYL CITRATE (PF) 100 MCG/2ML IJ SOLN
INTRAMUSCULAR | Status: DC | PRN
  Administered 2019-07-27 (×4): 50 ug via INTRAVENOUS

## 2019-07-27 MED ORDER — HYDROMORPHONE HCL 1 MG/ML IJ SOLN
INTRAMUSCULAR | Status: AC
  Filled 2019-07-27: qty 1

## 2019-07-27 MED ORDER — LACTATED RINGERS IV SOLN: INTRAVENOUS | Status: DC

## 2019-07-27 MED ORDER — BISACODYL 5 MG PO TBEC: 5.0000 mg | DELAYED_RELEASE_TABLET | Freq: Every day | ORAL | Status: DC | PRN

## 2019-07-27 MED ORDER — MORPHINE SULFATE (PF) 2 MG/ML IV SOLN
1.0000 mg | INTRAVENOUS | Status: DC | PRN
  Administered 2019-07-27 (×4): 2 mg via INTRAVENOUS
  Filled 2019-07-27 (×4): qty 1

## 2019-07-27 MED ORDER — ACETAMINOPHEN 500 MG PO TABS
ORAL_TABLET | ORAL | Status: AC
  Filled 2019-07-27: qty 2

## 2019-07-27 MED ORDER — HYDROMORPHONE HCL 1 MG/ML IJ SOLN
INTRAMUSCULAR | Status: DC | PRN
  Administered 2019-07-27 (×2): .5 mg via INTRAVENOUS

## 2019-07-27 MED ORDER — ONDANSETRON HCL 4 MG PO TABS: 4.0000 mg | ORAL_TABLET | Freq: Four times a day (QID) | ORAL | Status: DC | PRN

## 2019-07-27 MED ORDER — LABETALOL HCL 5 MG/ML IV SOLN
20.0000 mg | INTRAVENOUS | Status: DC | PRN
  Administered 2019-07-27: 20 mg via INTRAVENOUS
  Filled 2019-07-27: qty 4

## 2019-07-27 MED ORDER — FENTANYL CITRATE (PF) 100 MCG/2ML IJ SOLN
INTRAMUSCULAR | Status: AC
  Administered 2019-07-27: 25 ug via INTRAVENOUS
  Filled 2019-07-27: qty 2

## 2019-07-27 MED ORDER — SCOPOLAMINE 1 MG/3DAYS TD PT72
MEDICATED_PATCH | TRANSDERMAL | Status: AC
  Filled 2019-07-27: qty 1

## 2019-07-27 MED ORDER — EPHEDRINE SULFATE 50 MG/ML IJ SOLN
INTRAMUSCULAR | Status: AC
  Filled 2019-07-27: qty 1

## 2019-07-27 MED ORDER — DOCUSATE SODIUM 100 MG PO CAPS: 100.0000 mg | ORAL_CAPSULE | Freq: Every day | ORAL | Status: DC | PRN

## 2019-07-27 MED ORDER — PROPOFOL 10 MG/ML IV BOLUS
INTRAVENOUS | Status: DC | PRN
  Administered 2019-07-27: 150 mg via INTRAVENOUS

## 2019-07-27 MED ORDER — MIDAZOLAM HCL 2 MG/2ML IJ SOLN
INTRAMUSCULAR | Status: AC
  Filled 2019-07-27: qty 2

## 2019-07-27 MED ORDER — PHENYLEPHRINE HCL (PRESSORS) 10 MG/ML IV SOLN
INTRAVENOUS | Status: AC
  Filled 2019-07-27: qty 1

## 2019-07-27 MED ORDER — ONDANSETRON HCL 4 MG/2ML IJ SOLN
INTRAMUSCULAR | Status: AC
  Filled 2019-07-27: qty 2

## 2019-07-27 MED ORDER — OXYCODONE HCL 5 MG PO TABS: 5.0000 mg | ORAL_TABLET | Freq: Once | ORAL | Status: DC | PRN

## 2019-07-27 MED ORDER — ACETAMINOPHEN 500 MG PO TABS
1000.0000 mg | ORAL_TABLET | Freq: Four times a day (QID) | ORAL | Status: DC
  Administered 2019-07-27 – 2019-07-28 (×4): 1000 mg via ORAL
  Filled 2019-07-27 (×4): qty 2

## 2019-07-27 MED ORDER — FENTANYL CITRATE (PF) 100 MCG/2ML IJ SOLN
25.0000 ug | INTRAMUSCULAR | Status: AC | PRN
  Administered 2019-07-27 (×4): 25 ug via INTRAVENOUS

## 2019-07-27 MED ORDER — KETOROLAC TROMETHAMINE 30 MG/ML IJ SOLN
30.0000 mg | Freq: Three times a day (TID) | INTRAMUSCULAR | Status: DC | PRN
  Administered 2019-07-28: 30 mg via INTRAVENOUS
  Filled 2019-07-27 (×2): qty 1

## 2019-07-27 MED ORDER — ROCURONIUM BROMIDE 100 MG/10ML IV SOLN
INTRAVENOUS | Status: DC | PRN
  Administered 2019-07-27: 10 mg via INTRAVENOUS
  Administered 2019-07-27: 50 mg via INTRAVENOUS
  Administered 2019-07-27 (×2): 10 mg via INTRAVENOUS

## 2019-07-27 MED ORDER — CLINDAMYCIN PHOSPHATE 900 MG/50ML IV SOLN
INTRAVENOUS | Status: AC
  Filled 2019-07-27: qty 50

## 2019-07-27 MED ORDER — GLYCOPYRROLATE 0.2 MG/ML IJ SOLN
INTRAMUSCULAR | Status: AC
  Filled 2019-07-27: qty 1

## 2019-07-27 MED ORDER — DEXMEDETOMIDINE HCL IN NACL 200 MCG/50ML IV SOLN
INTRAVENOUS | Status: DC | PRN
  Administered 2019-07-27: 4 ug via INTRAVENOUS

## 2019-07-27 MED ORDER — SUCCINYLCHOLINE CHLORIDE 20 MG/ML IJ SOLN
INTRAMUSCULAR | Status: AC
  Filled 2019-07-27: qty 1

## 2019-07-27 MED ORDER — SODIUM CHLORIDE 0.9 % IV SOLN
INTRAVENOUS | Status: DC | PRN
  Administered 2019-07-27: 10:00:00 55 mL

## 2019-07-27 MED ORDER — GABAPENTIN 300 MG PO CAPS
300.0000 mg | ORAL_CAPSULE | ORAL | Status: AC
  Administered 2019-07-27: 07:00:00 300 mg via ORAL

## 2019-07-27 MED ORDER — FAMOTIDINE 20 MG PO TABS
ORAL_TABLET | ORAL | Status: AC
  Filled 2019-07-27: qty 1

## 2019-07-27 MED ORDER — KETOROLAC TROMETHAMINE 30 MG/ML IJ SOLN
INTRAMUSCULAR | Status: DC | PRN
  Administered 2019-07-27: 30 mg via INTRAVENOUS

## 2019-07-27 MED ORDER — ROCURONIUM BROMIDE 50 MG/5ML IV SOLN
INTRAVENOUS | Status: AC
  Filled 2019-07-27: qty 1

## 2019-07-27 MED ORDER — DEXAMETHASONE SODIUM PHOSPHATE 10 MG/ML IJ SOLN
INTRAMUSCULAR | Status: DC | PRN
  Administered 2019-07-27: 10 mg via INTRAVENOUS

## 2019-07-27 MED ORDER — OXYCODONE HCL 5 MG PO TABS
5.0000 mg | ORAL_TABLET | ORAL | Status: DC | PRN
  Administered 2019-07-27 – 2019-07-28 (×5): 10 mg via ORAL
  Filled 2019-07-27 (×5): qty 2

## 2019-07-27 MED ORDER — SUGAMMADEX SODIUM 500 MG/5ML IV SOLN
INTRAVENOUS | Status: DC | PRN
  Administered 2019-07-27: 350 mg via INTRAVENOUS

## 2019-07-27 MED ORDER — LISINOPRIL 10 MG PO TABS
10.0000 mg | ORAL_TABLET | Freq: Every day | ORAL | Status: DC
  Administered 2019-07-27: 10 mg via ORAL
  Filled 2019-07-27 (×2): qty 1

## 2019-07-27 MED ORDER — MIDAZOLAM HCL 2 MG/2ML IJ SOLN
INTRAMUSCULAR | Status: DC | PRN
  Administered 2019-07-27: 2 mg via INTRAVENOUS

## 2019-07-27 MED ORDER — PROPOFOL 10 MG/ML IV BOLUS
INTRAVENOUS | Status: AC
  Filled 2019-07-27: qty 40

## 2019-07-27 MED ORDER — KETOROLAC TROMETHAMINE 30 MG/ML IJ SOLN
INTRAMUSCULAR | Status: AC
  Filled 2019-07-27: qty 1

## 2019-07-27 MED ORDER — BUPIVACAINE HCL 0.5 % IJ SOLN
INTRAMUSCULAR | Status: DC | PRN
  Administered 2019-07-27: 10 mL

## 2019-07-27 MED ORDER — SEVOFLURANE IN SOLN
RESPIRATORY_TRACT | Status: AC
  Filled 2019-07-27: qty 250

## 2019-07-27 MED ORDER — BUPIVACAINE LIPOSOME 1.3 % IJ SUSP
INTRAMUSCULAR | Status: AC
  Filled 2019-07-27: qty 20

## 2019-07-27 MED ORDER — EPHEDRINE SULFATE 50 MG/ML IJ SOLN
INTRAMUSCULAR | Status: DC | PRN
  Administered 2019-07-27 (×2): 5 mg via INTRAVENOUS

## 2019-07-27 MED ORDER — ONDANSETRON HCL 4 MG/2ML IJ SOLN
INTRAMUSCULAR | Status: DC | PRN
  Administered 2019-07-27 (×2): 4 mg via INTRAVENOUS

## 2019-07-27 MED ORDER — GABAPENTIN 300 MG PO CAPS
ORAL_CAPSULE | ORAL | Status: AC
  Filled 2019-07-27: qty 1

## 2019-07-27 MED ORDER — ONDANSETRON HCL 4 MG/2ML IJ SOLN: 4.0000 mg | Freq: Four times a day (QID) | INTRAMUSCULAR | Status: DC | PRN

## 2019-07-27 SURGICAL SUPPLY — 49 items
APL PRP STRL LF DISP 70% ISPRP (MISCELLANEOUS) ×2
CANISTER SUCT 1200ML W/VALVE (MISCELLANEOUS) ×3 IMPLANT
CHLORAPREP W/TINT 26 (MISCELLANEOUS) ×3 IMPLANT
COUNTER NEEDLE 20/40 LG (NEEDLE) ×1 IMPLANT
COVER WAND RF STERILE (DRAPES) ×3 IMPLANT
DRAPE LAP W/FLUID (DRAPES) ×3 IMPLANT
DRAPE UNDER BUTTOCK W/FLU (DRAPES) ×3 IMPLANT
DRSG TELFA 3X8 NADH (GAUZE/BANDAGES/DRESSINGS) ×3 IMPLANT
ELECT BLADE 6.5 EXT (BLADE) IMPLANT
ELECT CAUTERY BLADE 6.4 (BLADE) IMPLANT
ELECT REM PT RETURN 9FT ADLT (ELECTROSURGICAL) ×3
ELECTRODE REM PT RTRN 9FT ADLT (ELECTROSURGICAL) ×2 IMPLANT
GAUZE 4X4 16PLY RFD (DISPOSABLE) ×3 IMPLANT
GAUZE SPONGE 4X4 12PLY STRL (GAUZE/BANDAGES/DRESSINGS) ×3 IMPLANT
GLOVE BIO SURGEON STRL SZ7 (GLOVE) ×6 IMPLANT
GLOVE BIO SURGEON STRL SZ8 (GLOVE) ×9 IMPLANT
GLOVE BIOGEL PI IND STRL 6.5 (GLOVE) ×2 IMPLANT
GLOVE BIOGEL PI INDICATOR 6.5 (GLOVE) ×1
GOWN STRL REUS W/ TWL LRG LVL3 (GOWN DISPOSABLE) ×4 IMPLANT
GOWN STRL REUS W/ TWL XL LVL3 (GOWN DISPOSABLE) ×2 IMPLANT
GOWN STRL REUS W/TWL LRG LVL3 (GOWN DISPOSABLE) ×6
GOWN STRL REUS W/TWL XL LVL3 (GOWN DISPOSABLE) ×3
KIT TURNOVER CYSTO (KITS) ×3 IMPLANT
LABEL OR SOLS (LABEL) ×3 IMPLANT
MANIFOLD NEPTUNE II (INSTRUMENTS) ×2 IMPLANT
NEEDLE HYPO 22GX1.5 SAFETY (NEEDLE) ×6 IMPLANT
PACK BASIN MAJOR ARMC (MISCELLANEOUS) ×3 IMPLANT
PAD DRESSING TELFA 3X8 NADH (GAUZE/BANDAGES/DRESSINGS) ×2 IMPLANT
PENCIL SMOKE ULTRAEVAC 22 CON (MISCELLANEOUS) ×3 IMPLANT
RETAINER VISCERA MED (MISCELLANEOUS) IMPLANT
SET CYSTO W/LG BORE CLAMP LF (SET/KITS/TRAYS/PACK) ×3 IMPLANT
SOL PREP PVP 2OZ (MISCELLANEOUS) ×3
SOLUTION PREP PVP 2OZ (MISCELLANEOUS) ×2 IMPLANT
SPONGE LAP 18X18 RF (DISPOSABLE) ×2 IMPLANT
STAPLER INSORB 30 2030 C-SECTI (MISCELLANEOUS) IMPLANT
STAPLER SKIN PROX 35W (STAPLE) IMPLANT
SURGILUBE 2OZ TUBE FLIPTOP (MISCELLANEOUS) ×3 IMPLANT
SUT PDS AB 1 TP1 96 (SUTURE) IMPLANT
SUT VIC AB 0 CT1 27 (SUTURE) ×6
SUT VIC AB 0 CT1 27XCR 8 STRN (SUTURE) ×4 IMPLANT
SUT VIC AB 0 CT1 36 (SUTURE) ×10 IMPLANT
SUT VIC AB 2-0 SH 27 (SUTURE) ×6
SUT VIC AB 2-0 SH 27XBRD (SUTURE) ×8 IMPLANT
SUT VICRYL PLUS ABS 0 54 (SUTURE) ×3 IMPLANT
SYR 20ML LL LF (SYRINGE) ×6 IMPLANT
SYR 30ML LL (SYRINGE) ×3 IMPLANT
SYR BULB IRRIG 60ML STRL (SYRINGE) ×3 IMPLANT
TRAY FOLEY MTR SLVR 16FR STAT (SET/KITS/TRAYS/PACK) ×3 IMPLANT
WATER STERILE IRR 1000ML POUR (IV SOLUTION) ×3 IMPLANT

## 2019-07-27 NOTE — Anesthesia Post-op Follow-up Note (Signed)
Anesthesia QCDR form completed.        

## 2019-07-27 NOTE — Progress Notes (Signed)
Pt is ready for TAH/ BSO . NPO . All questions answered .  Labs reviewed proceed .

## 2019-07-27 NOTE — Brief Op Note (Signed)
07/27/2019  9:56 AM  PATIENT:  Susan Harper  48 y.o. female  PRE-OPERATIVE DIAGNOSIS: complex  Left Ovarian Mass  POST-OPERATIVE DIAGNOSIS: complex  Left Ovarian Mass frozen section left ovary benign  PROCEDURE:  Procedure(s): HYSTERECTOMY ABDOMINAL WITH SALPINGO-OOPHORECTOMY (Bilateral) LAPAROTOMY (N/A) TAH/ BSO   SURGEON:  Surgeon(s) and Role:    * Biruk Troia, Gwen Her, MD - Primary    * Benjaman Kindler, MD - Assisting  PHYSICIAN ASSISTANT:   ASSISTANTS: none   ANESTHESIA:   general  EBL:  250 mL IOF 1300 UO 75 cc  BLOOD ADMINISTERED:none  DRAINS: Urinary Catheter (Foley)   LOCAL MEDICATIONS USED:  MARCAINE    and BUPIVICAINE   SPECIMEN:  Source of Specimen:  cx , uterus , fallopian tubves and bilateral ovaries . Pelvic washings   DISPOSITION OF SPECIMEN:  PATHOLOGY  COUNTS:  YES  TOURNIQUET:  * No tourniquets in log *  DICTATION: .Other Dictation: Dictation Number verbal  PLAN OF CARE: Admit to inpatient   PATIENT DISPOSITION:  PACU - hemodynamically stable.   Delay start of Pharmacological VTE agent (>24hrs) due to surgical blood loss or risk of bleeding: not applicable

## 2019-07-27 NOTE — Anesthesia Procedure Notes (Signed)
Procedure Name: Intubation Performed by: Kelton Pillar, CRNA Pre-anesthesia Checklist: Patient identified, Emergency Drugs available, Suction available and Patient being monitored Patient Re-evaluated:Patient Re-evaluated prior to induction Oxygen Delivery Method: Circle system utilized Preoxygenation: Pre-oxygenation with 100% oxygen Induction Type: IV induction Ventilation: Mask ventilation without difficulty Laryngoscope Size: McGraph and 3 Grade View: Grade I Tube type: Oral Tube size: 6.5 mm Number of attempts: 1 Airway Equipment and Method: Stylet Placement Confirmation: ETT inserted through vocal cords under direct vision,  positive ETCO2,  CO2 detector and breath sounds checked- equal and bilateral Secured at: 21 cm Tube secured with: Tape Dental Injury: Teeth and Oropharynx as per pre-operative assessment

## 2019-07-27 NOTE — Anesthesia Postprocedure Evaluation (Signed)
Anesthesia Post Note  Patient: Susan Harper  Procedure(s) Performed: HYSTERECTOMY ABDOMINAL WITH SALPINGO-OOPHORECTOMY (Bilateral ) LAPAROTOMY (N/A )  Patient location during evaluation: PACU Anesthesia Type: General Level of consciousness: awake and alert Pain management: pain level controlled Vital Signs Assessment: post-procedure vital signs reviewed and stable Respiratory status: spontaneous breathing, nonlabored ventilation and respiratory function stable Cardiovascular status: blood pressure returned to baseline and stable Postop Assessment: no apparent nausea or vomiting Anesthetic complications: no     Last Vitals:  Vitals:   07/27/19 1130 07/27/19 1236  BP: (!) 162/86 (!) 150/91  Pulse: 97 (!) 110  Resp: 18 18  Temp:  37.1 C  SpO2: 95% 95%    Last Pain:  Vitals:   07/27/19 1424  TempSrc:   PainSc: [place]

## 2019-07-27 NOTE — Transfer of Care (Signed)
Immediate Anesthesia Transfer of Care Note  Patient: Susan Harper  Procedure(s) Performed: HYSTERECTOMY ABDOMINAL WITH SALPINGO-OOPHORECTOMY (Bilateral ) LAPAROTOMY (N/A )  Patient Location: PACU  Anesthesia Type:General  Level of Consciousness: awake, drowsy, patient cooperative and responds to stimulation  Airway & Oxygen Therapy: Patient Spontanous Breathing and Patient connected to face mask oxygen  Post-op Assessment: Report given to RN and Post -op Vital signs reviewed and stable  Post vital signs: Reviewed and stable  Last Vitals:  Vitals Value Taken Time  BP 176/94 07/27/19 1015  Temp 36 C 07/27/19 1015  Pulse 92 07/27/19 1018  Resp 18 07/27/19 1018  SpO2 100 % 07/27/19 1018  Vitals shown include unvalidated device data.  Last Pain:  Vitals:   07/27/19 0609  TempSrc: Tympanic  PainSc: 5          Complications: No apparent anesthesia complications

## 2019-07-27 NOTE — Progress Notes (Signed)
Patient ID: GENE NISKA, female   DOB: [birth year], 48 y.o.   MRN: [id number] DOs - pain 5/10 on PO meds  And supplemental Iv  Clear liquids  VSS: 157/87 Lungs CTA   Cv RRR  A: Stable  HTN  P; add labetalol 20 mg IV to keep SBP< 150 and or DBP < 90  Keep on clears for now

## 2019-07-27 NOTE — Op Note (Signed)
NAME: Susan Harper, Susan Harper MEDICAL RECORD [id number] ACCOUNT [id number] DATE OF BIRTH:25-Nov-1970 FACILITY: [facility] LOCATION: [facility] PHYSICIAN:Lynford Espinoza Josefine Class, MD  OPERATIVE REPORT  DATE OF PROCEDURE:  07/27/2019  PREOPERATIVE DIAGNOSIS:  Complex left ovarian mass.  POSTOPERATIVE DIAGNOSIS:  Complex left ovarian mass, frozen section benign.  PROCEDURE: 1.  Total abdominal hysterectomy, bilateral salpingo-oophorectomy.   2.  Pelvic washings.  ANESTHESIA:  General endotracheal anesthesia.  SURGEON:  Laverta Baltimore, MD  FIRST ASSISTANT:  Benjaman Kindler, MD  INDICATIONS:  A 48 year old female with a symptomatic 17 x 10 complex left ovarian cyst.  Cancer tumor markers negative.  GYN oncologist evaluated the patient and felt that this mass was benign.  DESCRIPTION OF PROCEDURE:  After adequate general endotracheal anesthesia, the patient was placed in dorsal supine position, legs in the Ace[place]unas stirrups.  The patient's abdomen, perineum and vagina were prepped and draped in normal sterile fashion.   Timeout was performed.  Foley catheter was placed.  The patient did receive gentamicin and clindamycin for surgical prophylaxis given her PENICILLIN allergy.  A Pfannenstiel incision was made 2 fingerbreadths above the symphysis pubis.  Sharp dissection  was used to identify the fascia.  Fascia was opened in the midline and opened in a transverse fashion.  The superior aspect of the fascia was grasped with Kocher clamps and the recti muscles were dissected free.  Inferior aspect of the fascia was grasped  with Kocher clamps and pyramidalis muscle was dissected free.  Entry into the peritoneal cavity was accomplished sharply.  An O'Connor-O'Sullivan retractor was placed in the patient's abdomen and the bowel was packed cephalad with laparotomy sponges.   Large left ovarian mass was brought through the incision and the infundibulopelvic ligament was doubly clamped and the specimen was  removed intact and sent to pathology for frozen section.  Pathologist confirmed what appeared to be a benign process.  The  cornua were then bilaterally clamped and used for elevation during the procedure.  The round ligaments on both sides were clamped, transected, suture ligated with 0 Vicryl suture.  Anterior leaf of the broad ligament was incised along the bladder  reflection to the midline on both sides.  The bladder was then gently dissected off the lower uterine segment and the cervix with sharp dissection and the sponge stick.  The left uterine artery was skeletonized, bilaterally clamped with Heaney clamps,  transected and suture ligated with 0 Vicryl suture.  The patient's right infundibulopelvic ligament was doubly clamped, transected.  This was after the normal peristaltic activity was seen of the ureter.  The left ureter was identified after removal of  the large ovarian mass.  Right uterine artery was skeletonized, doubly clamped, transected and suture ligated with 0 Vicryl suture.  Vaginal cuff angles were bilaterally clamped and the uterus and cervix were delivered and sent to pathology for  identification.  The vaginal angles were closed with 0 Vicryl suture and the remaining vaginal cuff was closed with interrupted 0 Vicryl suture.  Good hemostasis was noted.  The patient's pelvis was copiously irrigated with normal saline.  A few  figure-of-eight sutures were required at the cuff again for good hemostasis and there was some small oozing at the left infundibulopelvic ligament where the large ovarian cyst had been previously removed.  Peritoneum was oversewn with 2-0 Vicryl suture  with good hemostasis.  Again, ureters appeared normal bilaterally with normal peristaltic activity at the end of the case.  No active bleeding identified.  All sponges were removed.  Fascia was closed with 0 Vicryl suture in a running nonlocking fashion,  2 separate sutures were used.  Fascial edges were then  injected with a solution of 20 mL of 1.3% Exparel plus 30 mL of 0.5% Marcaine plus 50 mL normal saline; approximately 50 mL of solution was injected in the fascial edges.  Subcutaneous tissues were  irrigated and bovied for hemostasis and the skin was reapproximated with Insorb absorbable staples.  Good cosmetic effect.  Additional 35 mL of Exparel solution was injected beneath the skin.  COMPLICATIONS:  There were no complications.  ESTIMATED BLOOD LOSS:  250 mL  INTRAOPERATIVE FLUIDS:  1300 mL  URINE OUTPUT:  75 mL  DISPOSITION:  The patient tolerated the procedure well and was taken to recovery room in good condition.  TN/NUANCE  D:07/27/2019 T:07/27/2019 JOB:[id number]

## 2019-07-27 NOTE — Anesthesia Preprocedure Evaluation (Signed)
Anesthesia Evaluation  Patient identified by MRN, date of birth, ID band Patient awake    Reviewed: Allergy & Precautions, H&P , NPO status , Patient's Chart, lab work & pertinent test results  Airway Mallampati: II  TM Distance: >3 FB Neck ROM: full    Dental  (+) Teeth Intact   Pulmonary neg pulmonary ROS, Current Smoker and Patient abstained from smoking.,           Cardiovascular hypertension, (-) angina(-) Past MI (-) dysrhythmias      Neuro/Psych negative neurological ROS  negative psych ROS   GI/Hepatic negative GI ROS, Neg liver ROS,   Endo/Other  negative endocrine ROS  Renal/GU      Musculoskeletal   Abdominal   Peds  Hematology negative hematology ROS (+)   Anesthesia Other Findings Past Medical History: No date: Anemia No date: Hypertension No date: Ovarian cancer ([place])  Past Surgical History: No date: FEMUR SURGERY  BMI    Body Mass Index: 29.74 kg/m      Reproductive/Obstetrics negative OB ROS                             Anesthesia Physical Anesthesia Plan  ASA: II  Anesthesia Plan: General ETT   Post-op Pain Management:    Induction:   PONV Risk Score and Plan: Ondansetron, Dexamethasone, Midazolam and Treatment may vary due to age or medical condition  Airway Management Planned:   Additional Equipment:   Intra-op Plan:   Post-operative Plan:   Informed Consent: I have reviewed the patients History and Physical, chart, labs and discussed the procedure including the risks, benefits and alternatives for the proposed anesthesia with the patient or authorized representative who has indicated his/her understanding and acceptance.     Dental Advisory Given  Plan Discussed with: Anesthesiologist  Anesthesia Plan Comments:         Anesthesia Quick Evaluation

## 2019-07-28 LAB — BASIC METABOLIC PANEL
Anion gap: 7 (ref 5–15)
BUN: 24 mg/dL — ABNORMAL HIGH (ref 6–20)
CO2: 26 mmol/L (ref 22–32)
Calcium: 8.9 mg/dL (ref 8.9–10.3)
Chloride: 99 mmol/L (ref 98–111)
Creatinine, Ser: 0.8 mg/dL (ref 0.44–1.00)
GFR calc Af Amer: >60 mL/min (ref >60)
GFR calc non Af Amer: >60 mL/min (ref >60)
Glucose, Bld: 102 mg/dL — ABNORMAL HIGH (ref 70–99)
Potassium: 4.2 mmol/L (ref 3.5–5.1)
Sodium: 132 mmol/L — ABNORMAL LOW (ref 135–145)

## 2019-07-28 LAB — CBC
HCT: 32.2 % — ABNORMAL LOW (ref 36.0–46.0)
Hemoglobin: 10.3 g/dL — ABNORMAL LOW (ref 12.0–15.0)
MCH: 31.9 pg (ref 26.0–34.0)
MCHC: 32 g/dL (ref 30.0–36.0)
MCV: 99.7 fL (ref 80.0–100.0)
Platelets: 207 10*3/uL (ref 150–400)
RBC: 3.23 MIL/uL — ABNORMAL LOW (ref 3.87–5.11)
RDW: 13 % (ref 11.5–15.5)
WBC: 10.6 10*3/uL — ABNORMAL HIGH (ref 4.0–10.5)
nRBC: 0 % (ref 0.0–0.2)

## 2019-07-28 MED ORDER — GABAPENTIN 300 MG PO CAPS: 300.0000 mg | ORAL_CAPSULE | Freq: Every day | ORAL | 0 refills | Status: DC

## 2019-07-28 MED ORDER — ONDANSETRON HCL 4 MG PO TABS: 4.0000 mg | ORAL_TABLET | Freq: Four times a day (QID) | ORAL | 0 refills | Status: DC | PRN

## 2019-07-28 MED ORDER — IBUPROFEN 800 MG PO TABS: 800.0000 mg | ORAL_TABLET | Freq: Three times a day (TID) | ORAL | 0 refills | Status: DC | PRN

## 2019-07-28 MED ORDER — OXYCODONE-ACETAMINOPHEN 5-325 MG PO TABS: 1.0000 | ORAL_TABLET | ORAL | 0 refills | Status: DC | PRN

## 2019-07-28 NOTE — Progress Notes (Signed)
Pt ambulating well on her own. Pt stated on day shift she made several laps around nurses station.   Drinking water and ate 50% of food tray for dinner. Pt requested chips around 2200. Explained importance of clear liquids and she stated she wanted to try to eat something. Kept the chips down. Denied any nausea/vomitting.  Pt asked about "jerky movements" she has been having "ever since Wednesday" per patient. Nurse stated she would pass that along to day shift and make sure Dr. is aware. She said it only happens "every once in awhile".  BP was elevated at shift change (pt stated Dr. Ouida Sills said she could take her BP med from home (lisinopril)) and IV labetalol 20mg  was given at [place]. BP rechecked at 1945: 142/82. BP's after that time have been within normal range. Will continue to monitor. The patient thinks her BP elevates when her pain worsens.   Pain control has been difficult.  -tylenol 1,000mg  PO every 6 hours -toradol 30mg  IV every 8 hours -roxicodone 10mg  PO every 4 hours -morphine 2mg  IV every 3 hours  Explained gas pain to patient and explained importance of moving around, turning, coughing, deep breathing, and using incentive spirometer every 1-2 hours. Giving simethicone chewable tablet 80mg  4x daily.   Pt sleeping currently. Will continue to monitor.

## 2019-07-28 NOTE — Discharge Summary (Signed)
Physician Discharge Summary  Patient ID: Susan Harper MRN: [id number] DOB/AGE: [birth year] 50 y.o.  Admit date: 07/27/2019 Discharge date: 07/28/2019  Admission Diagnoses:complex left ]ovarian cyst   Discharge Diagnoses:  Active Problems:   Postoperative state   Discharged Condition: good  Hospital Course: pt underwent a TAH / BSO . Frozen path for 17x10 cm mass - benign .  Post op day #1 tolerating po med and regular diet   Consults: None  Significant Diagnostic Studies: labs:  Results for orders placed or performed during the hospital encounter of 07/27/19 (from the past 72 hour(s))  Pregnancy, urine POC     Status: None   Collection Time: 07/27/19  6:12 AM  Result Value Ref Range   Preg Test, Ur NEGATIVE NEGATIVE    Comment:        THE SENSITIVITY OF THIS METHODOLOGY IS >24 mIU/mL   CBC     Status: None   Collection Time: 07/27/19  6:29 AM  Result Value Ref Range   WBC 8.4 4.0 - 10.5 K/uL   RBC 4.33 3.87 - 5.11 MIL/uL   Hemoglobin 14.3 12.0 - 15.0 g/dL   HCT 42.6 36.0 - 46.0 %   MCV 98.4 80.0 - 100.0 fL   MCH 33.0 26.0 - 34.0 pg   MCHC 33.6 30.0 - 36.0 g/dL   RDW 12.9 11.5 - 15.5 %   Platelets 246 150 - 400 K/uL   nRBC 0.0 0.0 - 0.2 %    Comment: Performed at [facility], [address] [place], [place] [id number]  Basic metabolic panel     Status: Abnormal   Collection Time: 07/27/19  6:29 AM  Result Value Ref Range   Sodium 136 135 - 145 mmol/L   Potassium 4.1 3.5 - 5.1 mmol/L   Chloride 102 98 - 111 mmol/L   CO2 23 22 - 32 mmol/L   Glucose, Bld 109 (H) 70 - 99 mg/dL   BUN 22 (H) 6 - 20 mg/dL   Creatinine, Ser 0.52 0.44 - 1.00 mg/dL   Calcium 10.1 8.9 - 10.3 mg/dL   GFR calc non Af Amer >60 >60 mL/min   GFR calc Af Amer >60 >60 mL/min   Anion gap 11 5 - 15    Comment: Performed at [facility], [address] [place], [place] [address]  Type and screen [place]     Status: None   Collection Time:  07/27/19  6:29 AM  Result Value Ref Range   ABO/RH(D) O POS    Antibody Screen NEG    Sample Expiration      07/30/2019,2359 Performed at [facility], [address] [place], [place] [address]   ABO/Rh     Status: None   Collection Time: 07/27/19  6:35 AM  Result Value Ref Range   ABO/RH(D)      O POS Performed at [facility], [address] [place], [place] [address]   CBC     Status: Abnormal   Collection Time: 07/28/19  6:30 AM  Result Value Ref Range   WBC 10.6 (H) 4.0 - 10.5 K/uL   RBC 3.23 (L) 3.87 - 5.11 MIL/uL   Hemoglobin 10.3 (L) 12.0 - 15.0 g/dL    Comment: REPEATED TO VERIFY   HCT 32.2 (L) 36.0 - 46.0 %   MCV 99.7 80.0 - 100.0 fL   MCH 31.9 26.0 - 34.0 pg   MCHC 32.0 30.0 - 36.0 g/dL   RDW  13.0 11.5 - 15.5 %   Platelets 207 150 - 400 K/uL   nRBC 0.0 0.0 - 0.2 %    Comment: Performed at [facility], [address] [place], [place] [id number]  Basic metabolic panel     Status: Abnormal   Collection Time: 07/28/19  6:30 AM  Result Value Ref Range   Sodium 132 (L) 135 - 145 mmol/L   Potassium 4.2 3.5 - 5.1 mmol/L   Chloride 99 98 - 111 mmol/L   CO2 26 22 - 32 mmol/L   Glucose, Bld 102 (H) 70 - 99 mg/dL   BUN 24 (H) 6 - 20 mg/dL   Creatinine, Ser 0.80 0.44 - 1.00 mg/dL   Calcium 8.9 8.9 - 10.3 mg/dL   GFR calc non Af Amer >60 >60 mL/min   GFR calc Af Amer >60 >60 mL/min   Anion gap 7 5 - 15    Comment: Performed at [facility], [address] [place], [place] [address]    Treatments: surgery: as above  Discharge Exam: Blood pressure 109/61, pulse 60, temperature 98.4 F (36.9 C), temperature source Oral, resp. rate 18, height 5\' 5"  (1.651 m), weight 81.1 kg, last menstrual period 03/30/2017, SpO2 99 %. General appearance: alert and cooperative Resp: clear to auscultation bilaterally Cardio: regular rate and rhythm, S1, S2 normal, no murmur, click, rub or gallop GI: soft, non-tender; bowel sounds normal; no  masses,  no organomegaly Incision/Wound:old blood under the honey comb dressing  Disposition: Discharge disposition: 01-Home or Self Care       Discharge Instructions    Call MD for:  difficulty breathing, headache or visual disturbances   Complete by: As directed    Call MD for:  extreme fatigue   Complete by: As directed    Call MD for:  hives   Complete by: As directed    Call MD for:  persistant dizziness or light-headedness   Complete by: As directed    Call MD for:  persistant nausea and vomiting   Complete by: As directed    Call MD for:  redness, tenderness, or signs of infection (pain, swelling, redness, odor or green/yellow discharge around incision site)   Complete by: As directed    Call MD for:  severe uncontrolled pain   Complete by: As directed    Call MD for:  temperature >100.4   Complete by: As directed    Diet - low sodium heart healthy   Complete by: As directed    Increase activity slowly   Complete by: As directed      Allergies as of 07/28/2019      Reactions   Penicillins Swelling   Of the face Did it involve swelling of the face/tongue/throat, SOB, or low BP? Yes Did it involve sudden or severe rash/hives, skin peeling, or any reaction on the inside of your mouth or nose? No Did you need to seek medical attention at a hospital or doctor's office? Yes When did it last happen?in her late 63s If all above answers are "NO", may proceed with cephalosporin use.      Medication List    STOP taking these medications   acetaminophen 500 MG tablet Commonly known as: TYLENOL   HYDROcodone-acetaminophen 5-325 MG tablet Commonly known as: NORCO/VICODIN     TAKE these medications   cetirizine 10 MG tablet Commonly known as: ZYRTEC Take 10 mg by mouth daily as needed for allergies.   gabapentin 300 MG capsule Commonly known  as: NEURONTIN Take 1 capsule (300 mg total) by mouth at bedtime.   ibuprofen 800 MG tablet Commonly known as:  ADVIL Take 1 tablet (800 mg total) by mouth every 8 (eight) hours as needed.   lisinopril-hydrochlorothiazide 10-12.5 MG tablet Commonly known as: ZESTORETIC Take 1 tablet by mouth daily.   Multi-Vitamin tablet Take 1 tablet by mouth daily.   ondansetron 4 MG tablet Commonly known as: ZOFRAN Take 1 tablet (4 mg total) by mouth every 6 (six) hours as needed for nausea or vomiting.   oxyCODONE-acetaminophen 5-325 MG tablet Commonly known as: Percocet Take 1 tablet by mouth every 4 (four) hours as needed for severe pain.      Follow-up Information    Darl Brisbin, Gwen Her, MD Follow up in 5 day(s).   Specialty: Obstetrics and Gynecology Why: 12/24 at [facility] appt already made Contact information: [address] [place] [address] [phone]           Signed: [place] 07/28/2019, 10:46 AM

## 2019-07-28 NOTE — Progress Notes (Signed)
Pt discharged home. Discharge instructions, prescriptions, and follow up appointments given to and reviewed with pt. Pt verbalized understanding. Escorted out by staff.

## 2019-07-30 LAB — CYTOLOGY - NON PAP

## 2019-07-30 LAB — SURGICAL PATHOLOGY

## 2019-08-09 ENCOUNTER — Ambulatory Visit: Payer: Self-pay | Admitting: Family Medicine

## 2020-01-29 ENCOUNTER — Other Ambulatory Visit: Payer: Self-pay | Admitting: General Surgery

## 2020-01-29 DIAGNOSIS — K432 Incisional hernia without obstruction or gangrene: Secondary | ICD-10-CM

## 2020-01-31 ENCOUNTER — Other Ambulatory Visit: Payer: Self-pay

## 2020-01-31 ENCOUNTER — Ambulatory Visit
Admission: RE | Admit: 2020-01-31 | Discharge: 2020-01-31 | Disposition: A | Discharge status: Home / Self Care | Payer: Self-pay | Source: Ambulatory Visit | Attending: General Surgery | Admitting: General Surgery

## 2020-01-31 DIAGNOSIS — K432 Incisional hernia without obstruction or gangrene: Secondary | ICD-10-CM | POA: Insufficient documentation

## 2020-02-12 ENCOUNTER — Other Ambulatory Visit: Payer: Self-pay

## 2020-02-12 ENCOUNTER — Other Ambulatory Visit
Admission: RE | Admit: 2020-02-12 | Discharge: 2020-02-12 | Disposition: A | Discharge status: Home / Self Care | Payer: Self-pay | Source: Ambulatory Visit | Attending: General Surgery | Admitting: General Surgery

## 2020-02-12 ENCOUNTER — Ambulatory Visit: Payer: Self-pay | Admitting: General Surgery

## 2020-02-12 NOTE — H&P (View-Only) (Signed)
PATIENT PROFILE: Susan Harper is a 49 y.o. female who presents to the Clinic for consultation at the request of Dr. Ouida Sills for evaluation of incisional hernia.  PCP:  None  HISTORY OF PRESENT ILLNESS: Susan Harper reports she has been feeling a bulge on her lower abdomen since 4 weeks ago.  She reported the bulge is getting painful.  The pain is localized to the lower abdomen.  The pain is described as crampy.  The pain also radiates to her back.  Aggravating factor is increasing pressure of the abdominal cavity.  Alleviating factor is ibuprofen.  Patient had total hysterectomy 64-month ago.  Denies any other intra-abdominal surgeries.  Denies any problem with bleeding or bowel movement.   PROBLEM LIST:        Problem List  Never Reviewed       Noted   Other specified abdominal hernia without obstruction or gangrene Unknown   Uterine enlargement Unknown      GENERAL REVIEW OF SYSTEMS:   General ROS: negative for - chills, fatigue, fever, weight gain or weight loss Allergy and Immunology ROS: negative for - hives  Hematological and Lymphatic ROS: negative for - bleeding problems or bruising, negative for palpable nodes Endocrine ROS: negative for - heat or cold intolerance, hair changes Respiratory ROS: negative for - cough, shortness of breath or wheezing Cardiovascular ROS: no chest pain or palpitations GI ROS: negative for nausea, vomiting, diarrhea, constipation.  Positive for abdominal pain Musculoskeletal ROS: negative for - joint swelling or muscle pain Neurological ROS: negative for - confusion, syncope Dermatological ROS: negative for pruritus and rash Psychiatric: negative for anxiety, depression, difficulty sleeping and memory loss  MEDICATIONS: Current Medications        Current Outpatient Medications  Medication Sig Dispense Refill  . lisinopriL-hydrochlorothiazide (ZESTORETIC) 10-12.5 mg tablet Take 1 tablet by mouth once daily 30 tablet 11  .  multivitamin tablet Take 1 tablet by mouth once daily        No current facility-administered medications for this visit.      ALLERGIES: Penicillins  PAST MEDICAL HISTORY:     Past Medical History:  Diagnosis Date  . Seasonal allergies     PAST SURGICAL HISTORY:      Past Surgical History:  Procedure Laterality Date  . HYSTERECTOMY  07/27/2019   TAH BSO  . right femur surgery    . TAH BSO  07/27/2019     FAMILY HISTORY:      Family History  Problem Relation Age of Onset  . Diabetes Mother   . High blood pressure (Hypertension) Father   . Hyperlipidemia (Elevated cholesterol) Father   . Breast cancer Neg Hx   . Cervical cancer Neg Hx      SOCIAL HISTORY: Social History          Socioeconomic History  . Marital status: Single    Spouse name: Not on file  . Number of children: Not on file  . Years of education: Not on file  . Highest education level: Not on file  Occupational History  . Occupation: Freight forwarder  Tobacco Use  . Smoking status: Current Every Day Smoker    Packs/day: 0.50    Types: Cigarettes  . Smokeless tobacco: Never Used  Vaping Use  . Vaping Use: Never used  Substance and Sexual Activity  . Alcohol use: Yes    Comment: Ocassional  . Drug use: Never  . Sexual activity: Not Currently    Birth control/protection: Surgical  Other Topics  Concern  . Would you please tell us about the people who live in your home, your pets, or anything else important to your social life? Not Asked  Social History Narrative  . Not on file   Social Determinants of Health      Financial Resource Strain:   . Difficulty of Paying Living Expenses:   Food Insecurity:   . Worried About Charity fundraiser in the Last Year:   . Arboriculturist in the Last Year:   Transportation Needs:   . Film/video editor (Medical):   Marland Kitchen Lack of Transportation (Non-Medical):       PHYSICAL EXAM:    Vitals:   01/29/20 0911  BP:  124/82  Pulse: 89  Temp: 36.4 C (97.5 F)   Body mass index is 27.46 kg/m. Weight: 74.8 kg (165 lb)   GENERAL: Alert, active, oriented x3  HEENT: Pupils equal reactive to light. Extraocular movements are intact. Sclera clear. Palpebral conjunctiva normal red color.Pharynx clear.  NECK: Supple with no palpable mass and no adenopathy.  LUNGS: Sound clear with no rales rhonchi or wheezes.  HEART: Regular rhythm S1 and S2 without murmur.  ABDOMEN: Soft and depressible, nontender with no palpable mass, no hepatomegaly.  Palpable hernia on the lower abdomen, reducible, mild tender to palpation.  No abdominal distention.  EXTREMITIES: Well-developed well-nourished symmetrical with no dependent edema.  NEUROLOGICAL: Awake alert oriented, facial expression symmetrical, moving all extremities.  REVIEW OF DATA: I have reviewed the following data today:      No visits with results within 3 Month(s) from this visit.  Latest known visit with results is:  Appointment on 08/22/2019  Component Date Value  . Glucose 08/22/2019 93   . Sodium 08/22/2019 134*  . Potassium 08/22/2019 4.5   . Chloride 08/22/2019 104   . Carbon Dioxide (CO2) 08/22/2019 22.4   . Calcium 08/22/2019 9.8   . Urea Nitrogen (BUN) 08/22/2019 39*  . Creatinine 08/22/2019 0.8   . Glomerular Filtration Ra* 08/22/2019 76   . BUN/Crea Ratio 08/22/2019 48.8*  . Anion Gap w/K 08/22/2019 12.1      ASSESSMENT: Susan Harper is a 49 y.o. female presenting for consultation for incisional hernia.    Patient presents with an incisional hernia on the lower abdomen.  Due to the size of the hernia getting bigger so quick and being on the lower abdomen I recommend to proceed with preoperative CT scan of the abdominal wall for evaluation of the size of the hernia and the structures related to the hernia.  With the CT scan I will proceed with surgical planning to do a robotic assisted laparoscopic minimally invasive  procedure.  Patient oriented about the procedure.  Patient went to the risk of surgery that includes pain, infection, bleeding, injury to bowel, injury to bladder, among others.  Patient agreed to proceed.  Incisional hernia, without obstruction or gangrene [K43.2]  PLAN: 1. CT scan of the abdomen and pelvis without IV contrast 2. After CT scan, will coordinate a Robotic assisted laparoscopic incisional hernia repair with mesh ([id number]) 3. I will call you with the results of the CT scan and further discussion of surgical planning and coordination.   Patient verbalized understanding, all questions were answered, and were agreeable with the plan outlined above.

## 2020-02-12 NOTE — Patient Instructions (Signed)
COVID TESTING Date: February 14, 2020 Testing site:  [facility] [address] Thru Hours:  8:36 am - 1:00 pm Once you are tested, you are asked to stay quarantined (avoiding public places) until after your surgery.   Your procedure is scheduled on: Monday February 18, 2020 Report to Day Surgery on the 2nd floor of the [facility]. To find out your arrival time, please call [phone] between 1PM - 3PM on: Friday February 15, 2020  REMEMBER: Instructions that are not followed completely may result in serious medical risk, up to and including death; or upon the discretion of your surgeon and anesthesiologist your surgery may need to be rescheduled.  Do not eat food after midnight the night before surgery.  No gum chewing, lozengers or hard candies.  You may however, drink CLEAR liquids up to 2 hours before you are scheduled to arrive for your surgery. Do not drink anything within 2 hours of your scheduled arrival time.  Clear liquids include: - water  - apple juice without pulp - gatorade (not RED) - black coffee or tea (Do NOT add milk or creamers to the coffee or tea) Do NOT drink anything that is not on this list.  Type 1 and Type 2 diabetics should only drink water.   TAKE THESE MEDICATIONS THE MORNING OF SURGERY WITH A SIP OF WATER: None   Stop Anti-inflammatories (NSAIDS) such as Advil, Aleve, Ibuprofen, Motrin, Naproxen, Naprosyn and aspirin or  Aspirin based products such as Excedrin, Goodys Powder, BC Powder. (May take Tylenol or Acetaminophen if needed.)  Stop ANY OVER THE COUNTER supplements until after surgery. (May continue Vitamin D, Vitamin B, and multivitamin.)  No Alcohol for 24 hours before or after surgery.  No Smoking including e-cigarettes for 24 hours prior to surgery.  No chewable tobacco products for at least 6 hours prior to surgery.  No nicotine patches on the day of surgery.  Do not use any "recreational" drugs for at least a  week prior to your surgery.  Please be advised that the combination of cocaine and anesthesia may have negative outcomes, up to and including death. If you test positive for cocaine, your surgery will be cancelled.  On the morning of surgery brush your teeth with toothpaste and water, you may rinse your mouth with mouthwash if you wish. Do not swallow any toothpaste or mouthwash.  Do not wear jewelry, make-up, hairpins, clips or nail polish.  Do not wear lotions, powders, or perfumes.   Do not shave 48 hours prior to surgery.   Contact lenses, hearing aids and dentures may not be worn into surgery.  Do not bring valuables to the hospital. [facility] is not responsible for any missing/lost belongings or valuables.   Use CHG Soap  as directed on instruction sheet.  Notify your doctor if there is any change in your medical condition (cold, fever, infection).  Wear comfortable clothing (specific to your surgery type) to the hospital.  Plan for stool softeners for home use; pain medications have a tendency to cause constipation. You can also help prevent constipation by eating foods high in fiber such as fruits and vegetables and drinking plenty of fluids as your diet allows.  After surgery, you can help prevent lung complications by doing breathing exercises.  Take deep breaths and cough every 1-2 hours. Your doctor may order a device called an Incentive Spirometer to help you take deep breaths. When coughing or sneezing, hold a pillow firmly  against your incision with both hands. This is called "splinting." Doing this helps protect your incision. It also decreases belly discomfort.  If you are being admitted to the hospital overnight, leave your suitcase in the car. After surgery it may be brought to your room.  If you are being discharged the day of surgery, you will not be allowed to drive home. You will need a responsible adult (18 years or older) to drive you home and stay with  you that night.   If you are taking public transportation, you will need to have a responsible adult (18 years or older) with you. Please confirm with your physician that it is acceptable to use public transportation.   Please call the [facility] at [phone] if you have any questions about these instructions.  Visitation Policy:  Patients undergoing a surgery or procedure may have one family member or support person with them as long as that person is not COVID-19 positive or experiencing its symptoms.  That person may remain in the waiting area during the procedure.  Children under 61 years of age may have both parents or legal guardians with them during their procedure.  Inpatient Visitation Update:   Two designated support people may visit a patient during visiting hours 7 am to 8 pm. It must be the same two designated people for the duration of the patient stay. The visitors may come and go during the day, and there is no switching out to have different visitors. A mask must be worn at all times, including in the patient room.  Children under 76 years of age:  a total of 4 designated visitors for the child's entire stay are allowed. Only 2 in the room at a time and only one staying overnight at a time. The overnight guest can now rotate during the child's hospital stay.  As a reminder, masks are still required for all [place] team members, patients and visitors in all [facility] facilities.   Systemwide, no visitors 17 or younger.

## 2020-02-12 NOTE — H&P (Signed)
PATIENT PROFILE: Susan Harper is a 49 y.o. female who presents to the Clinic for consultation at the request of Dr. Ouida Sills for evaluation of incisional hernia.  PCP:  None  HISTORY OF PRESENT ILLNESS: Susan Harper reports she has been feeling a bulge on her lower abdomen since 4 weeks ago.  She reported the bulge is getting painful.  The pain is localized to the lower abdomen.  The pain is described as crampy.  The pain also radiates to her back.  Aggravating factor is increasing pressure of the abdominal cavity.  Alleviating factor is ibuprofen.  Patient had total hysterectomy 6-month ago.  Denies any other intra-abdominal surgeries.  Denies any problem with bleeding or bowel movement.   PROBLEM LIST:        Problem List  Never Reviewed       Noted   Other specified abdominal hernia without obstruction or gangrene Unknown   Uterine enlargement Unknown      GENERAL REVIEW OF SYSTEMS:   General ROS: negative for - chills, fatigue, fever, weight gain or weight loss Allergy and Immunology ROS: negative for - hives  Hematological and Lymphatic ROS: negative for - bleeding problems or bruising, negative for palpable nodes Endocrine ROS: negative for - heat or cold intolerance, hair changes Respiratory ROS: negative for - cough, shortness of breath or wheezing Cardiovascular ROS: no chest pain or palpitations GI ROS: negative for nausea, vomiting, diarrhea, constipation.  Positive for abdominal pain Musculoskeletal ROS: negative for - joint swelling or muscle pain Neurological ROS: negative for - confusion, syncope Dermatological ROS: negative for pruritus and rash Psychiatric: negative for anxiety, depression, difficulty sleeping and memory loss  MEDICATIONS: Current Medications        Current Outpatient Medications  Medication Sig Dispense Refill  . lisinopriL-hydrochlorothiazide (ZESTORETIC) 10-12.5 mg tablet Take 1 tablet by mouth once daily 30 tablet 11  .  multivitamin tablet Take 1 tablet by mouth once daily        No current facility-administered medications for this visit.      ALLERGIES: Penicillins  PAST MEDICAL HISTORY:     Past Medical History:  Diagnosis Date  . Seasonal allergies     PAST SURGICAL HISTORY:      Past Surgical History:  Procedure Laterality Date  . HYSTERECTOMY  07/27/2019   TAH BSO  . right femur surgery    . TAH BSO  07/27/2019     FAMILY HISTORY:      Family History  Problem Relation Age of Onset  . Diabetes Mother   . High blood pressure (Hypertension) Father   . Hyperlipidemia (Elevated cholesterol) Father   . Breast cancer Neg Hx   . Cervical cancer Neg Hx      SOCIAL HISTORY: Social History          Socioeconomic History  . Marital status: Single    Spouse name: Not on file  . Number of children: Not on file  . Years of education: Not on file  . Highest education level: Not on file  Occupational History  . Occupation: Freight forwarder  Tobacco Use  . Smoking status: Current Every Day Smoker    Packs/day: 0.50    Types: Cigarettes  . Smokeless tobacco: Never Used  Vaping Use  . Vaping Use: Never used  Substance and Sexual Activity  . Alcohol use: Yes    Comment: Ocassional  . Drug use: Never  . Sexual activity: Not Currently    Birth control/protection: Surgical  Other Topics  Concern  . Would you please tell us about the people who live in your home, your pets, or anything else important to your social life? Not Asked  Social History Narrative  . Not on file   Social Determinants of Health      Financial Resource Strain:   . Difficulty of Paying Living Expenses:   Food Insecurity:   . Worried About Charity fundraiser in the Last Year:   . Arboriculturist in the Last Year:   Transportation Needs:   . Film/video editor (Medical):   Marland Kitchen Lack of Transportation (Non-Medical):       PHYSICAL EXAM:    Vitals:   01/29/20 0911  BP:  124/82  Pulse: 89  Temp: 36.4 C (97.5 F)   Body mass index is 27.46 kg/m. Weight: 74.8 kg (165 lb)   GENERAL: Alert, active, oriented x3  HEENT: Pupils equal reactive to light. Extraocular movements are intact. Sclera clear. Palpebral conjunctiva normal red color.Pharynx clear.  NECK: Supple with no palpable mass and no adenopathy.  LUNGS: Sound clear with no rales rhonchi or wheezes.  HEART: Regular rhythm S1 and S2 without murmur.  ABDOMEN: Soft and depressible, nontender with no palpable mass, no hepatomegaly.  Palpable hernia on the lower abdomen, reducible, mild tender to palpation.  No abdominal distention.  EXTREMITIES: Well-developed well-nourished symmetrical with no dependent edema.  NEUROLOGICAL: Awake alert oriented, facial expression symmetrical, moving all extremities.  REVIEW OF DATA: I have reviewed the following data today:      No visits with results within 3 Month(s) from this visit.  Latest known visit with results is:  Appointment on 08/22/2019  Component Date Value  . Glucose 08/22/2019 93   . Sodium 08/22/2019 134*  . Potassium 08/22/2019 4.5   . Chloride 08/22/2019 104   . Carbon Dioxide (CO2) 08/22/2019 22.4   . Calcium 08/22/2019 9.8   . Urea Nitrogen (BUN) 08/22/2019 39*  . Creatinine 08/22/2019 0.8   . Glomerular Filtration Ra* 08/22/2019 76   . BUN/Crea Ratio 08/22/2019 48.8*  . Anion Gap w/K 08/22/2019 12.1      ASSESSMENT: Susan Harper is a 49 y.o. female presenting for consultation for incisional hernia.    Patient presents with an incisional hernia on the lower abdomen.  Due to the size of the hernia getting bigger so quick and being on the lower abdomen I recommend to proceed with preoperative CT scan of the abdominal wall for evaluation of the size of the hernia and the structures related to the hernia.  With the CT scan I will proceed with surgical planning to do a robotic assisted laparoscopic minimally invasive  procedure.  Patient oriented about the procedure.  Patient went to the risk of surgery that includes pain, infection, bleeding, injury to bowel, injury to bladder, among others.  Patient agreed to proceed.  Incisional hernia, without obstruction or gangrene [K43.2]  PLAN: 1. CT scan of the abdomen and pelvis without IV contrast 2. After CT scan, will coordinate a Robotic assisted laparoscopic incisional hernia repair with mesh ([id number]) 3. I will call you with the results of the CT scan and further discussion of surgical planning and coordination.   Patient verbalized understanding, all questions were answered, and were agreeable with the plan outlined above.

## 2020-02-14 ENCOUNTER — Encounter
Admission: RE | Admit: 2020-02-14 | Discharge: 2020-02-14 | Disposition: A | Discharge status: Home / Self Care | Payer: Self-pay | Source: Ambulatory Visit | Attending: General Surgery | Admitting: General Surgery

## 2020-02-14 ENCOUNTER — Other Ambulatory Visit: Payer: Self-pay

## 2020-02-14 DIAGNOSIS — Z20822 Contact with and (suspected) exposure to covid-19: Secondary | ICD-10-CM | POA: Insufficient documentation

## 2020-02-14 DIAGNOSIS — I10 Essential (primary) hypertension: Secondary | ICD-10-CM

## 2020-02-14 DIAGNOSIS — Z01810 Encounter for preprocedural cardiovascular examination: Secondary | ICD-10-CM

## 2020-02-14 DIAGNOSIS — Z01818 Encounter for other preprocedural examination: Secondary | ICD-10-CM | POA: Insufficient documentation

## 2020-02-14 LAB — BASIC METABOLIC PANEL
Anion gap: 11 (ref 5–15)
BUN: 40 mg/dL — ABNORMAL HIGH (ref 6–20)
CO2: 22 mmol/L (ref 22–32)
Calcium: 9.3 mg/dL (ref 8.9–10.3)
Chloride: 103 mmol/L (ref 98–111)
Creatinine, Ser: 1.04 mg/dL — ABNORMAL HIGH (ref 0.44–1.00)
GFR calc Af Amer: >60 mL/min (ref >60)
GFR calc non Af Amer: >60 mL/min (ref >60)
Glucose, Bld: 102 mg/dL — ABNORMAL HIGH (ref 70–99)
Potassium: 4.3 mmol/L (ref 3.5–5.1)
Sodium: 136 mmol/L (ref 135–145)

## 2020-02-14 LAB — SARS CORONAVIRUS 2 (TAT 6-24 HRS): SARS Coronavirus 2: NEGATIVE

## 2020-02-18 ENCOUNTER — Encounter
Admission: RE | Disposition: A | Discharge status: Home / Self Care | Payer: Self-pay | Source: Home / Self Care | Attending: General Surgery

## 2020-02-18 ENCOUNTER — Encounter: Payer: Self-pay | Admitting: General Surgery

## 2020-02-18 ENCOUNTER — Ambulatory Visit: Payer: Self-pay | Admitting: Family

## 2020-02-18 ENCOUNTER — Ambulatory Visit
Admission: RE | Admit: 2020-02-18 | Discharge: 2020-02-18 | Disposition: A | Discharge status: Home / Self Care | Payer: Self-pay | Attending: General Surgery | Admitting: General Surgery

## 2020-02-18 ENCOUNTER — Other Ambulatory Visit: Payer: Self-pay

## 2020-02-18 DIAGNOSIS — F17210 Nicotine dependence, cigarettes, uncomplicated: Secondary | ICD-10-CM | POA: Insufficient documentation

## 2020-02-18 DIAGNOSIS — K432 Incisional hernia without obstruction or gangrene: Secondary | ICD-10-CM | POA: Insufficient documentation

## 2020-02-18 DIAGNOSIS — I10 Essential (primary) hypertension: Secondary | ICD-10-CM | POA: Insufficient documentation

## 2020-02-18 DIAGNOSIS — Z880 Allergy status to penicillin: Secondary | ICD-10-CM | POA: Insufficient documentation

## 2020-02-18 HISTORY — PX: XI ROBOTIC ASSISTED VENTRAL HERNIA: SHX6789

## 2020-02-18 LAB — URINALYSIS, ROUTINE W REFLEX MICROSCOPIC
Bilirubin Urine: NEGATIVE
Glucose, UA: NEGATIVE mg/dL
Hgb urine dipstick: NEGATIVE
Ketones, ur: NEGATIVE mg/dL
Leukocytes,Ua: NEGATIVE
Nitrite: NEGATIVE
Protein, ur: NEGATIVE mg/dL
Specific Gravity, Urine: 1.018 (ref 1.005–1.030)
pH: 5 (ref 5.0–8.0)

## 2020-02-18 SURGERY — REPAIR, HERNIA, VENTRAL, ROBOT-ASSISTED
Anesthesia: General

## 2020-02-18 MED ORDER — ONDANSETRON HCL 4 MG/2ML IJ SOLN
INTRAMUSCULAR | Status: DC | PRN
  Administered 2020-02-18: 4 mg via INTRAVENOUS

## 2020-02-18 MED ORDER — DEXAMETHASONE SODIUM PHOSPHATE 10 MG/ML IJ SOLN
INTRAMUSCULAR | Status: AC
  Filled 2020-02-18: qty 1

## 2020-02-18 MED ORDER — HYDROCODONE-ACETAMINOPHEN 5-325 MG PO TABS: 1.0000 | ORAL_TABLET | ORAL | 0 refills | Status: AC | PRN

## 2020-02-18 MED ORDER — LIDOCAINE HCL (CARDIAC) PF 100 MG/5ML IV SOSY
PREFILLED_SYRINGE | INTRAVENOUS | Status: DC | PRN
  Administered 2020-02-18: 100 mg via INTRAVENOUS

## 2020-02-18 MED ORDER — FENTANYL CITRATE (PF) 100 MCG/2ML IJ SOLN
INTRAMUSCULAR | Status: DC | PRN
  Administered 2020-02-18: 25 ug via INTRAVENOUS
  Administered 2020-02-18 (×3): 50 ug via INTRAVENOUS
  Administered 2020-02-18: 25 ug via INTRAVENOUS

## 2020-02-18 MED ORDER — SEVOFLURANE IN SOLN
RESPIRATORY_TRACT | Status: AC
  Filled 2020-02-18: qty 250

## 2020-02-18 MED ORDER — PROPOFOL 10 MG/ML IV BOLUS
INTRAVENOUS | Status: DC | PRN
  Administered 2020-02-18: 150 mg via INTRAVENOUS
  Administered 2020-02-18: 50 mg via INTRAVENOUS

## 2020-02-18 MED ORDER — FENTANYL CITRATE (PF) 100 MCG/2ML IJ SOLN
INTRAMUSCULAR | Status: AC
  Filled 2020-02-18: qty 2

## 2020-02-18 MED ORDER — LACTATED RINGERS IV SOLN: INTRAVENOUS | Status: DC

## 2020-02-18 MED ORDER — OXYCODONE HCL 5 MG PO TABS
ORAL_TABLET | ORAL | Status: AC
  Filled 2020-02-18: qty 1

## 2020-02-18 MED ORDER — DEXAMETHASONE SODIUM PHOSPHATE 10 MG/ML IJ SOLN
INTRAMUSCULAR | Status: DC | PRN
  Administered 2020-02-18: 10 mg via INTRAVENOUS

## 2020-02-18 MED ORDER — SODIUM CHLORIDE 0.9 % IV SOLN: INTRAVENOUS | Status: DC | PRN

## 2020-02-18 MED ORDER — ONDANSETRON HCL 4 MG/2ML IJ SOLN
INTRAMUSCULAR | Status: AC
  Filled 2020-02-18: qty 2

## 2020-02-18 MED ORDER — ORAL CARE MOUTH RINSE: 15.0000 mL | Freq: Once | OROMUCOSAL | Status: AC

## 2020-02-18 MED ORDER — DEXMEDETOMIDINE HCL IN NACL 400 MCG/100ML IV SOLN
INTRAVENOUS | Status: DC | PRN
  Administered 2020-02-18 (×2): 4 ug via INTRAVENOUS

## 2020-02-18 MED ORDER — FENTANYL CITRATE (PF) 100 MCG/2ML IJ SOLN
25.0000 ug | INTRAMUSCULAR | Status: DC | PRN
  Administered 2020-02-18 (×2): 25 ug via INTRAVENOUS
  Administered 2020-02-18: 50 ug via INTRAVENOUS

## 2020-02-18 MED ORDER — ESMOLOL HCL 100 MG/10ML IV SOLN
INTRAVENOUS | Status: DC | PRN
  Administered 2020-02-18: 30 ug via INTRAVENOUS

## 2020-02-18 MED ORDER — CHLORHEXIDINE GLUCONATE 0.12 % MT SOLN
OROMUCOSAL | Status: AC
  Administered 2020-02-18: 15 mL via OROMUCOSAL
  Filled 2020-02-18: qty 15

## 2020-02-18 MED ORDER — BUPIVACAINE LIPOSOME 1.3 % IJ SUSP
INTRAMUSCULAR | Status: DC | PRN
  Administered 2020-02-18: 20 mL

## 2020-02-18 MED ORDER — CLINDAMYCIN PHOSPHATE 900 MG/50ML IV SOLN
INTRAVENOUS | Status: AC
  Filled 2020-02-18: qty 50

## 2020-02-18 MED ORDER — PROPOFOL 10 MG/ML IV BOLUS
INTRAVENOUS | Status: AC
  Filled 2020-02-18: qty 40

## 2020-02-18 MED ORDER — BUPIVACAINE LIPOSOME 1.3 % IJ SUSP
INTRAMUSCULAR | Status: AC
  Filled 2020-02-18: qty 20

## 2020-02-18 MED ORDER — SUGAMMADEX SODIUM 200 MG/2ML IV SOLN
INTRAVENOUS | Status: DC | PRN
  Administered 2020-02-18: 100 mg via INTRAVENOUS

## 2020-02-18 MED ORDER — LIDOCAINE HCL (PF) 2 % IJ SOLN
INTRAMUSCULAR | Status: AC
  Filled 2020-02-18: qty 5

## 2020-02-18 MED ORDER — ROCURONIUM BROMIDE 100 MG/10ML IV SOLN
INTRAVENOUS | Status: DC | PRN
  Administered 2020-02-18: 40 mg via INTRAVENOUS
  Administered 2020-02-18 (×2): 20 mg via INTRAVENOUS

## 2020-02-18 MED ORDER — BUPIVACAINE-EPINEPHRINE (PF) 0.25% -1:200000 IJ SOLN
INTRAMUSCULAR | Status: DC | PRN
  Administered 2020-02-18: 30 mL

## 2020-02-18 MED ORDER — FAMOTIDINE 20 MG PO TABS: 20.0000 mg | ORAL_TABLET | Freq: Once | ORAL | Status: AC

## 2020-02-18 MED ORDER — CHLORHEXIDINE GLUCONATE 0.12 % MT SOLN: 15.0000 mL | Freq: Once | OROMUCOSAL | Status: AC

## 2020-02-18 MED ORDER — EPHEDRINE SULFATE 50 MG/ML IJ SOLN
INTRAMUSCULAR | Status: DC | PRN
  Administered 2020-02-18: 5 mg via INTRAVENOUS

## 2020-02-18 MED ORDER — OXYCODONE HCL 5 MG/5ML PO SOLN: 5.0000 mg | Freq: Once | ORAL | Status: AC | PRN

## 2020-02-18 MED ORDER — MIDAZOLAM HCL 2 MG/2ML IJ SOLN
INTRAMUSCULAR | Status: DC | PRN
  Administered 2020-02-18: 2 mg via INTRAVENOUS

## 2020-02-18 MED ORDER — CEFAZOLIN SODIUM-DEXTROSE 2-4 GM/100ML-% IV SOLN
INTRAVENOUS | Status: AC
  Filled 2020-02-18: qty 100

## 2020-02-18 MED ORDER — ROCURONIUM BROMIDE 10 MG/ML (PF) SYRINGE
PREFILLED_SYRINGE | INTRAVENOUS | Status: AC
  Filled 2020-02-18: qty 10

## 2020-02-18 MED ORDER — OXYCODONE HCL 5 MG PO TABS
5.0000 mg | ORAL_TABLET | Freq: Once | ORAL | Status: AC | PRN
  Administered 2020-02-18: 5 mg via ORAL

## 2020-02-18 MED ORDER — MIDAZOLAM HCL 2 MG/2ML IJ SOLN
INTRAMUSCULAR | Status: AC
  Filled 2020-02-18: qty 2

## 2020-02-18 MED ORDER — CLINDAMYCIN PHOSPHATE 900 MG/50ML IV SOLN
900.0000 mg | INTRAVENOUS | Status: AC
  Administered 2020-02-18: 900 mg via INTRAVENOUS

## 2020-02-18 MED ORDER — FAMOTIDINE 20 MG PO TABS
ORAL_TABLET | ORAL | Status: AC
  Administered 2020-02-18: 20 mg via ORAL
  Filled 2020-02-18: qty 1

## 2020-02-18 MED ORDER — BUPIVACAINE-EPINEPHRINE (PF) 0.25% -1:200000 IJ SOLN
INTRAMUSCULAR | Status: AC
  Filled 2020-02-18: qty 30

## 2020-02-18 SURGICAL SUPPLY — 53 items
ADH SKN CLS APL DERMABOND .7 (GAUZE/BANDAGES/DRESSINGS) ×1
APL PRP STRL LF DISP 70% ISPRP (MISCELLANEOUS) ×1
BLADE SURG SZ11 CARB STEEL (BLADE) ×3 IMPLANT
CANISTER SUCT 1200ML W/VALVE (MISCELLANEOUS) ×3 IMPLANT
CANNULA REDUC XI 12-8 STAPL (CANNULA) ×2
CANNULA REDUC XI 12-8MM STAPL (CANNULA) ×1
CANNULA REDUCER 12-8 DVNC XI (CANNULA) IMPLANT
CHLORAPREP W/TINT 26 (MISCELLANEOUS) ×3 IMPLANT
COVER TIP SHEARS 8 DVNC (MISCELLANEOUS) ×1 IMPLANT
COVER TIP SHEARS 8MM DA VINCI (MISCELLANEOUS) ×3
COVER WAND RF STERILE (DRAPES) ×6 IMPLANT
DEFOGGER SCOPE WARMER CLEARIFY (MISCELLANEOUS) ×3 IMPLANT
DERMABOND ADVANCED (GAUZE/BANDAGES/DRESSINGS) ×2
DERMABOND ADVANCED .7 DNX12 (GAUZE/BANDAGES/DRESSINGS) ×1 IMPLANT
DRAPE ARM DVNC X/XI (DISPOSABLE) ×3 IMPLANT
DRAPE COLUMN DVNC XI (DISPOSABLE) ×1 IMPLANT
DRAPE DA VINCI XI ARM (DISPOSABLE) ×9
DRAPE DA VINCI XI COLUMN (DISPOSABLE) ×3
ELECT REM PT RETURN 9FT ADLT (ELECTROSURGICAL) ×3
ELECTRODE REM PT RTRN 9FT ADLT (ELECTROSURGICAL) ×1 IMPLANT
GLOVE BIO SURGEON STRL SZ 6.5 (GLOVE) ×7 IMPLANT
GLOVE BIO SURGEONS STRL SZ 6.5 (GLOVE) ×5
GLOVE BIOGEL PI IND STRL 6.5 (GLOVE) ×2 IMPLANT
GLOVE BIOGEL PI INDICATOR 6.5 (GLOVE) ×10
GOWN STRL REUS W/ TWL LRG LVL3 (GOWN DISPOSABLE) ×3 IMPLANT
GOWN STRL REUS W/TWL LRG LVL3 (GOWN DISPOSABLE) ×9
GRASPER SUT TROCAR 14GX15 (MISCELLANEOUS) ×2 IMPLANT
IRRIGATOR SUCT 8 DISP DVNC XI (IRRIGATION / IRRIGATOR) IMPLANT
IRRIGATOR SUCTION 8MM XI DISP (IRRIGATION / IRRIGATOR)
IV NS 1000ML (IV SOLUTION)
IV NS 1000ML BAXH (IV SOLUTION) IMPLANT
KIT PINK PAD W/HEAD ARE REST (MISCELLANEOUS) ×3
KIT PINK PAD W/HEAD ARM REST (MISCELLANEOUS) ×1 IMPLANT
LABEL OR SOLS (LABEL) ×3 IMPLANT
MESH VENTRALIGHT ST 4X6IN (Mesh General) ×2 IMPLANT
NDL INSUFFLATION 14GA 120MM (NEEDLE) ×1 IMPLANT
NEEDLE HYPO 22GX1.5 SAFETY (NEEDLE) ×3 IMPLANT
NEEDLE INSUFFLATION 14GA 120MM (NEEDLE) ×3 IMPLANT
OBTURATOR OPTICAL STANDARD 8MM (TROCAR) ×3
OBTURATOR OPTICAL STND 8 DVNC (TROCAR) ×1
OBTURATOR OPTICALSTD 8 DVNC (TROCAR) ×1 IMPLANT
PACK LAP CHOLECYSTECTOMY (MISCELLANEOUS) ×3 IMPLANT
SEAL CANN UNIV 5-8 DVNC XI (MISCELLANEOUS) ×3 IMPLANT
SEAL XI 5MM-8MM UNIVERSAL (MISCELLANEOUS) ×9
SET TUBE SMOKE EVAC HIGH FLOW (TUBING) ×3 IMPLANT
SOLUTION ELECTROLUBE (MISCELLANEOUS) ×3 IMPLANT
STAPLER CANNULA SEAL DVNC XI (STAPLE) IMPLANT
STAPLER CANNULA SEAL XI (STAPLE) ×3
SUT MNCRL AB 4-0 PS2 18 (SUTURE) ×5 IMPLANT
SUT STRATAFIX PDS 30 CT-1 (SUTURE) ×3 IMPLANT
SUT VLOC 90 2/L VL 12 GS22 (SUTURE) ×6 IMPLANT
TAPE TRANSPORE STRL 2 31045 (GAUZE/BANDAGES/DRESSINGS) ×3 IMPLANT
TRAY FOLEY MTR SLVR 16FR STAT (SET/KITS/TRAYS/PACK) IMPLANT

## 2020-02-18 NOTE — OR Nursing (Signed)
Results called to dr Windell Moment no new orders

## 2020-02-18 NOTE — OR Nursing (Signed)
Patient reports feeling cramping in the lower abd, denies frequency, burning with urination and no odor.  Dr Windell Moment in to see patient, order received.

## 2020-02-18 NOTE — Transfer of Care (Signed)
Immediate Anesthesia Transfer of Care Note  Patient: Susan Harper  Procedure(s) Performed: xi ROBOTIC ASSISTED LAPAROSCOPIC INCISIONAL HERNIA with mesh (N/A )  Patient Location: PACU  Anesthesia Type:General  Level of Consciousness: awake, drowsy and patient cooperative  Airway & Oxygen Therapy: Patient Spontanous Breathing and Patient connected to face mask oxygen  Post-op Assessment: Report given to RN and Post -op Vital signs reviewed and stable  Post vital signs: Reviewed and stable  Last Vitals:  Vitals Value Taken Time  BP 155/79 02/18/20 1332  Temp 36.2 C 02/18/20 1332  Pulse 90 02/18/20 1334  Resp 14 02/18/20 1334  SpO2 100 % 02/18/20 1334  Vitals shown include unvalidated device data.  Last Pain:  Vitals:   02/18/20 0825  TempSrc: Temporal  PainSc: 0-No pain         Complications: No complications documented.

## 2020-02-18 NOTE — Op Note (Signed)
Preoperative diagnosis: Incisional Hernia  Postoperative diagnosis: Incisional Hernia  Procedure: Robotic assisted laparoscopic incisional hernia repair with mesh  Anesthesia: General  Surgeon: Dr. Windell Moment  Wound Classification: Clean  Specimen: Hernia sac  Complications: None  Estimated Blood Loss: 54ml  Indications: A 49 year old female with symptomatic incisional hernia.   Findings: 1. 5 cm incisional  hernia 4. Tension free repair achieved with 10 cm x 15 cm bard mesh and suture 5. Adequate hemostasis  Description of procedure: The patient was brought to the operating room and general anesthesia was induced. A time-out was completed verifying correct patient, procedure, site, positioning, and implant(s) and/or special equipment prior to beginning this procedure. Antibiotics were administered prior to making the incision. SCDs placed. The anterior abdominal wall was prepped and draped in the standard sterile fashion.   Palmer's point chosen for entry.  Veress needle placed and abdomen insufflated to 15cm without any dramatic increase in pressure.  Needle removed and optiview technique used to place 25mm port at same point.  No injury noted during placement. Exparel was infused in a TAP block. Two additional ports,along cephalad aspect of the abdomen placed (12 mm in the right subcostal and 9mm in the subxiphoid).  Xi robot then docked into place.  Hernia contents noted and reduced with combination of blunt, sharp dissection with scissors and fenestrated forceps.  Hemostasis achieved throughout this portion.  Once all hernia contents reduced, there was noted to be a 5 cm hernia.    Insufflation dropped to 40mm and transfacial suture with 0 stratafix used to primarily close defect under minimal tension. Bard protected 10 cm 15 cm mesh was placed within the abdominal cavity through 70mm port and secured to the abdominal wall centered over the defect using the 0 stratafix previously  used to primarily close defect.  The mesh was then circumferentially sutured into the anterior abdominal wall using 2-0 VLock x2.  Any bleeding noted during this portion was no longer actively bleeding by end of securing mesh and tightening the suture.   Robot was undocked.  The 55mm cannula was removed and port site was closed using PMI device and 0 vicryl suture, ensuring no bowels were injured during this process.  Abdomen then desufflated while camera within abdomen to ensure no signs of new bleed prior to removing camera and rest of ports completely.  12 mm port site skin closed using 3-0 Vicryl for the deep dermal layer in an interrupted fashion and  All skin incisions closed with runninrg 4-0 Monocryl in a subcuticular fashion.  All wounds then dressed with Dermabond.  Patient was then successfully awakened and transferred to PACU in stable condition.  At the end of the procedure sponge and instrument counts were correct.

## 2020-02-18 NOTE — Anesthesia Preprocedure Evaluation (Signed)
Anesthesia Evaluation  Patient identified by MRN, date of birth, ID band Patient awake    Reviewed: Allergy & Precautions, H&P , NPO status , Patient's Chart, lab work & pertinent test results  History of Anesthesia Complications Negative for: history of anesthetic complications  Airway Mallampati: II  TM Distance: >3 FB Neck ROM: full    Dental  (+) Poor Dentition, Chipped   Pulmonary neg shortness of breath, Current Smoker and Patient abstained from smoking.,    Pulmonary exam normal        Cardiovascular Exercise Tolerance: Good hypertension, (-) angina(-) Past MI Normal cardiovascular exam(-) dysrhythmias      Neuro/Psych negative neurological ROS  negative psych ROS   GI/Hepatic negative GI ROS, Neg liver ROS, neg GERD  ,  Endo/Other  negative endocrine ROS  Renal/GU      Musculoskeletal   Abdominal   Peds  Hematology negative hematology ROS (+)   Anesthesia Other Findings Past Medical History: No date: Anemia No date: Hypertension No date: Ovarian cancer ([place])  Past Surgical History: No date: FEMUR SURGERY  BMI    Body Mass Index: 29.74 kg/m      Reproductive/Obstetrics negative OB ROS                             Anesthesia Physical  Anesthesia Plan  ASA: II  Anesthesia Plan: General ETT   Post-op Pain Management:    Induction: Intravenous  PONV Risk Score and Plan: Ondansetron, Dexamethasone, Midazolam and Treatment may vary due to age or medical condition  Airway Management Planned: Oral ETT  Additional Equipment:   Intra-op Plan:   Post-operative Plan: Extubation in OR  Informed Consent: I have reviewed the patients History and Physical, chart, labs and discussed the procedure including the risks, benefits and alternatives for the proposed anesthesia with the patient or authorized representative who has indicated his/her understanding and acceptance.      Dental Advisory Given  Plan Discussed with: Anesthesiologist  Anesthesia Plan Comments: (Patient consented for risks of anesthesia including but not limited to:  - adverse reactions to medications - damage to eyes, teeth, lips or other oral mucosa - nerve damage due to positioning  - sore throat or hoarseness - Damage to heart, brain, nerves, lungs, other parts of body or loss of life  Patient voiced understanding.)        Anesthesia Quick Evaluation

## 2020-02-18 NOTE — Interval H&P Note (Signed)
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  The various methods of treatment have been discussed with the patient and family. After consideration of risks, benefits and other options for treatment, the patient has consented to  Procedure(s): xi ROBOTIC [place] (N/A) as a surgical intervention.  The patient's history has been reviewed, patient examined, no change in status, stable for surgery.  I have reviewed the patient's chart and labs.  Questions were answered to the patient's satisfaction.     Herbert Pun

## 2020-02-18 NOTE — Discharge Instructions (Signed)
AMBULATORY SURGERY  DISCHARGE INSTRUCTIONS   1) The drugs that you were given will stay in your system until tomorrow so for the next 24 hours you should not:  A) Drive an automobile B) Make any legal decisions C) Drink any alcoholic beverage   2) You may resume regular meals tomorrow.  Today it is better to start with liquids and gradually work up to solid foods.  You may eat anything you prefer, but it is better to start with liquids, then soup and crackers, and gradually work up to solid foods.   3) Please notify your doctor immediately if you have any unusual bleeding, trouble breathing, redness and pain at the surgery site, drainage, fever, or pain not relieved by medication.    4) Additional Instructions:        Please contact your physician with any problems or Same Day Surgery at [phone], Monday through Friday 6 am to 4 pm, or [facility] number at [phone]. Diet: Resume home heart healthy regular diet.   Activity: No heavy lifting >20 pounds (children, pets, laundry, garbage) or strenuous activity until follow-up, but light activity and walking are encouraged. Do not drive or drink alcohol if taking narcotic pain medications.  Wound care: May shower with soapy water and pat dry (do not rub incisions), but no baths or submerging incision underwater until follow-up. (no swimming)   Medications: Resume all home medications. For mild to moderate pain: acetaminophen (Tylenol) or ibuprofen (if no kidney disease). Combining Tylenol with alcohol can substantially increase your risk of causing liver disease. Narcotic pain medications, if prescribed, can be used for severe pain, though may cause nausea, constipation, and drowsiness. Do not combine Tylenol and Norco within a 6 hour period as Norco contains Tylenol. If you do not need the narcotic pain medication, you do not need to fill the prescription.  Call office ([phone]) at any time if any  questions, worsening pain, fevers/chills, bleeding, drainage from incision site, or other concerns.  

## 2020-02-18 NOTE — Anesthesia Procedure Notes (Signed)
Procedure Name: Intubation Date/Time: 02/18/2020 11:28 AM Performed by: Gentry Fitz, CRNA Pre-anesthesia Checklist: Patient identified, Emergency Drugs available, Suction available and Patient being monitored Patient Re-evaluated:Patient Re-evaluated prior to induction Oxygen Delivery Method: Circle system utilized Preoxygenation: Pre-oxygenation with 100% oxygen Induction Type: IV induction Ventilation: Mask ventilation without difficulty Laryngoscope Size: McGraph and 3 Grade View: Grade I Tube type: Oral Number of attempts: 1 Airway Equipment and Method: Stylet Placement Confirmation: ETT inserted through vocal cords under direct vision,  positive ETCO2 and breath sounds checked- equal and bilateral Secured at: 20 cm Tube secured with: Tape Dental Injury: Teeth and Oropharynx as per pre-operative assessment

## 2020-02-19 ENCOUNTER — Encounter: Payer: Self-pay | Admitting: General Surgery

## 2020-02-19 NOTE — Anesthesia Postprocedure Evaluation (Signed)
Anesthesia Post Note  Patient: Susan Harper  Procedure(s) Performed: xi ROBOTIC ASSISTED LAPAROSCOPIC INCISIONAL HERNIA with mesh (N/A )  Patient location during evaluation: PACU Anesthesia Type: General Level of consciousness: awake and alert and oriented Pain management: pain level controlled Vital Signs Assessment: post-procedure vital signs reviewed and stable Respiratory status: spontaneous breathing Cardiovascular status: blood pressure returned to baseline Anesthetic complications: no   No complications documented.   Last Vitals:  Vitals:   02/18/20 1455 02/18/20 1519  BP: (!) 156/80 (!) 167/75  Pulse: 62 75  Resp: 16 18  Temp:    SpO2: 100% 99%    Last Pain:  Vitals:   02/18/20 1519  TempSrc:   PainSc: 2                  Geneive Sandstrom

## 2020-02-20 ENCOUNTER — Encounter: Payer: Self-pay | Admitting: General Surgery

## 2020-02-20 LAB — SURGICAL PATHOLOGY

## 2023-09-06 ENCOUNTER — Other Ambulatory Visit: Payer: Self-pay

## 2023-09-06 ENCOUNTER — Encounter (HOSPITAL_COMMUNITY): Payer: Self-pay

## 2023-09-06 ENCOUNTER — Emergency Department (HOSPITAL_COMMUNITY): Payer: Self-pay

## 2023-09-06 ENCOUNTER — Emergency Department (HOSPITAL_COMMUNITY)
Admission: EM | Admit: 2023-09-06 | Discharge: 2023-09-06 | Discharge status: Against Medical Advice | Payer: 59 | Attending: Emergency Medicine | Admitting: Emergency Medicine

## 2023-09-06 DIAGNOSIS — S0990XA Unspecified injury of head, initial encounter: Secondary | ICD-10-CM | POA: Diagnosis present

## 2023-09-06 DIAGNOSIS — Z5321 Procedure and treatment not carried out due to patient leaving prior to being seen by health care provider: Secondary | ICD-10-CM | POA: Diagnosis not present

## 2023-09-06 DIAGNOSIS — S0011XA Contusion of right eyelid and periocular area, initial encounter: Secondary | ICD-10-CM | POA: Diagnosis not present

## 2023-09-06 DIAGNOSIS — W1830XA Fall on same level, unspecified, initial encounter: Secondary | ICD-10-CM | POA: Insufficient documentation

## 2023-09-06 MED ORDER — ACETAMINOPHEN 500 MG PO TABS
1000.0000 mg | ORAL_TABLET | Freq: Once | ORAL | Status: AC
  Administered 2023-09-06: 1000 mg via ORAL
  Filled 2023-09-06: qty 2

## 2023-09-06 NOTE — ED Provider Triage Note (Signed)
Emergency Medicine Provider Triage Evaluation Note  Susan Harper , a 53 y.o. female  was evaluated in triage.  Pt complains of fall with swelling in right periorbital region with blurry vision. Notes that she had a fall this morning striking the right side of her head when her right knee "gave out". Notes swelling just above right eye and blurry vision. Denies any history of bleeding disorders or anticoagulation.   Review of Systems  Positive: Headache, swelling right periorbital region Negative: Syncope, dizziness, lightheadedness, weakness, numbness, paresthesias  Physical Exam  BP (!) 154/100 (BP Location: Right Arm)   Pulse (!) 102   Temp 98 F (36.7 C)   Resp 18   LMP 03/30/2017 (Approximate)   SpO2 98%  Gen:   Awake, no distress   Resp:  Normal effort  MSK:   Moves extremities without difficulty  Other:  Hematoma noted just superior to right eye, no c-spine, t-spine, l-spine tenderness, no other long bone or joint tenderness on exam  Medical Decision Making  Medically screening exam initiated at 11:06 AM.  Appropriate orders placed.  GINETTE BRADWAY was informed that the remainder of the evaluation will be completed by another provider, this initial triage assessment does not replace that evaluation, and the importance of remaining in the ED until their evaluation is complete.  Workup initiated in triage to include CT scans   Lelon Perla, PA-C 09/06/23 1110

## 2023-09-06 NOTE — ED Triage Notes (Signed)
PT arrives via POV. Pt reports her right knee gave out this morning while getting out of her car, that caused her to fall and hit the right side of her face and head. PT reports headache and blurred vision to right eye. PT denies blood thinners, no loc. PT is AxOx4. Large hematoma noted above right eye.

## 2023-09-20 ENCOUNTER — Encounter (HOSPITAL_COMMUNITY): Payer: Self-pay

## 2024-03-15 ENCOUNTER — Encounter (HOSPITAL_BASED_OUTPATIENT_CLINIC_OR_DEPARTMENT_OTHER): Payer: Self-pay

## 2024-03-15 ENCOUNTER — Other Ambulatory Visit: Payer: Self-pay

## 2024-03-15 ENCOUNTER — Emergency Department (HOSPITAL_BASED_OUTPATIENT_CLINIC_OR_DEPARTMENT_OTHER)
Admission: EM | Admit: 2024-03-15 | Discharge: 2024-03-15 | Disposition: A | Discharge status: Home / Self Care | Payer: Self-pay | Attending: Emergency Medicine | Admitting: Emergency Medicine

## 2024-03-15 ENCOUNTER — Emergency Department (HOSPITAL_BASED_OUTPATIENT_CLINIC_OR_DEPARTMENT_OTHER): Payer: Self-pay | Admitting: Radiology

## 2024-03-15 DIAGNOSIS — E860 Dehydration: Secondary | ICD-10-CM | POA: Insufficient documentation

## 2024-03-15 DIAGNOSIS — J069 Acute upper respiratory infection, unspecified: Secondary | ICD-10-CM | POA: Insufficient documentation

## 2024-03-15 DIAGNOSIS — Z8543 Personal history of malignant neoplasm of ovary: Secondary | ICD-10-CM | POA: Insufficient documentation

## 2024-03-15 DIAGNOSIS — R197 Diarrhea, unspecified: Secondary | ICD-10-CM

## 2024-03-15 LAB — RESP PANEL BY RT-PCR (RSV, FLU A&B, COVID)  RVPGX2
Influenza A by PCR: NEGATIVE
Influenza B by PCR: NEGATIVE
Resp Syncytial Virus by PCR: NEGATIVE
SARS Coronavirus 2 by RT PCR: NEGATIVE

## 2024-03-15 LAB — CBC WITH DIFFERENTIAL/PLATELET
Abs Immature Granulocytes: 0.04 K/uL (ref 0.00–0.07)
Basophils Absolute: 0.1 K/uL (ref 0.0–0.1)
Basophils Relative: 1 %
Eosinophils Absolute: 0.2 K/uL (ref 0.0–0.5)
Eosinophils Relative: 2 %
HCT: 32.8 % — ABNORMAL LOW (ref 36.0–46.0)
Hemoglobin: 10.9 g/dL — ABNORMAL LOW (ref 12.0–15.0)
Immature Granulocytes: 0 %
Lymphocytes Relative: 19 %
Lymphs Abs: 1.7 K/uL (ref 0.7–4.0)
MCH: 33 pg (ref 26.0–34.0)
MCHC: 33.2 g/dL (ref 30.0–36.0)
MCV: 99.4 fL (ref 80.0–100.0)
Monocytes Absolute: 0.6 K/uL (ref 0.1–1.0)
Monocytes Relative: 7 %
Neutro Abs: 6.7 K/uL (ref 1.7–7.7)
Neutrophils Relative %: 71 %
Platelets: 239 K/uL (ref 150–400)
RBC: 3.3 MIL/uL — ABNORMAL LOW (ref 3.87–5.11)
RDW: 13.3 % (ref 11.5–15.5)
WBC: 9.3 K/uL (ref 4.0–10.5)
nRBC: 0 % (ref 0.0–0.2)

## 2024-03-15 LAB — COMPREHENSIVE METABOLIC PANEL WITH GFR
ALT: 68 U/L — ABNORMAL HIGH (ref 0–44)
AST: 113 U/L — ABNORMAL HIGH (ref 15–41)
Albumin: 4.6 g/dL (ref 3.5–5.0)
Alkaline Phosphatase: 125 U/L (ref 38–126)
Anion gap: 17 — ABNORMAL HIGH (ref 5–15)
BUN: 29 mg/dL — ABNORMAL HIGH (ref 6–20)
CO2: 17 mmol/L — ABNORMAL LOW (ref 22–32)
Calcium: 10.1 mg/dL (ref 8.9–10.3)
Chloride: 100 mmol/L (ref 98–111)
Creatinine, Ser: 1.53 mg/dL — ABNORMAL HIGH (ref 0.44–1.00)
GFR, Estimated: 40 mL/min — ABNORMAL LOW (ref >60)
Glucose, Bld: 114 mg/dL — ABNORMAL HIGH (ref 70–99)
Potassium: 4.5 mmol/L (ref 3.5–5.1)
Sodium: 134 mmol/L — ABNORMAL LOW (ref 135–145)
Total Bilirubin: 0.3 mg/dL (ref 0.0–1.2)
Total Protein: 7.8 g/dL (ref 6.5–8.1)

## 2024-03-15 LAB — BASIC METABOLIC PANEL WITH GFR
Anion gap: 15 (ref 5–15)
BUN: 26 mg/dL — ABNORMAL HIGH (ref 6–20)
CO2: 17 mmol/L — ABNORMAL LOW (ref 22–32)
Calcium: 9.9 mg/dL (ref 8.9–10.3)
Chloride: 101 mmol/L (ref 98–111)
Creatinine, Ser: 1.39 mg/dL — ABNORMAL HIGH (ref 0.44–1.00)
GFR, Estimated: 45 mL/min — ABNORMAL LOW (ref >60)
Glucose, Bld: 108 mg/dL — ABNORMAL HIGH (ref 70–99)
Potassium: 4.2 mmol/L (ref 3.5–5.1)
Sodium: 133 mmol/L — ABNORMAL LOW (ref 135–145)

## 2024-03-15 MED ORDER — LACTATED RINGERS IV BOLUS
1000.0000 mL | Freq: Once | INTRAVENOUS | Status: AC
Start: 2024-03-15 — End: 2024-03-15
  Administered 2024-03-15: 1000 mL via INTRAVENOUS

## 2024-03-15 MED ORDER — LOPERAMIDE HCL 2 MG PO CAPS
4.0000 mg | ORAL_CAPSULE | Freq: Once | ORAL | Status: AC
  Administered 2024-03-15: 4 mg via ORAL
  Filled 2024-03-15: qty 2

## 2024-03-15 MED ORDER — LOPERAMIDE HCL 2 MG PO CAPS: 2.0000 mg | ORAL_CAPSULE | Freq: Four times a day (QID) | ORAL | 0 refills | Status: AC | PRN

## 2024-03-15 MED ORDER — BENZONATATE 100 MG PO CAPS: 100.0000 mg | ORAL_CAPSULE | Freq: Three times a day (TID) | ORAL | 0 refills | Status: AC

## 2024-03-15 MED ORDER — BENZONATATE 100 MG PO CAPS
200.0000 mg | ORAL_CAPSULE | Freq: Once | ORAL | Status: AC
  Administered 2024-03-15: 200 mg via ORAL
  Filled 2024-03-15: qty 2

## 2024-03-15 NOTE — Discharge Instructions (Signed)
 You were seen in the emergency department today for concerns of a cough and diarrhea.  Your labs were largely reassuring although you were dehydrated.  There is no evidence of COVID-19, influenza, RSV.  Your chest x-ray is negative for any signs of pneumonia.  I would strongly recommend that you follow-up with your primary care provider for repeat evaluation to ensure symptoms are improving.  For concerns of new or worsening symptoms, return to the emergency department.

## 2024-03-15 NOTE — ED Triage Notes (Signed)
 Patient reports cough, runny nose, and congestion since Tuesday.

## 2024-03-15 NOTE — ED Provider Notes (Signed)
 [place] EMERGENCY DEPARTMENT AT [facility] Provider Note   CSN: [id number] Arrival date & time: 03/15/24  1143     Patient presents with: Cough   Susan Harper is a 53 y.o. female.  Patient with history significant for iron deficiency anemia, community-acquired pneumonia on multiple occasions, hypertension, and ovarian cancer here with concerns of a cough.  She reports that she developed a cough, runny nose, and congestion since Tuesday with.  Denies significant improvement after using Mucinex.  For that she is having some chest discomfort when she coughs due to the amount of cough that she is having.  She also reports multiple episodes diarrhea in the last several days that she estimates around 5-6 episodes per day.  No reported vomiting.   Cough      Prior to Admission medications   Medication Sig Start Date End Date Taking? Authorizing Provider  benzonatate  (TESSALON ) 100 MG capsule Take 1 capsule (100 mg total) by mouth every 8 (eight) hours. 03/15/24  Yes Kimble Delaurentis A, PA-C  loperamide  (IMODIUM ) 2 MG capsule Take 1 capsule (2 mg total) by mouth 4 (four) times daily as needed for diarrhea or loose stools. 03/15/24  Yes Murrel [name] A, PA-C  acetaminophen  (TYLENOL ) 500 MG tablet Take 1,000-1,500 mg by mouth every 8 (eight) hours as needed for moderate pain or headache.    [provider]  diphenhydrAMINE (BENADRYL) 25 MG tablet Take 25 mg by mouth daily as needed for allergies.    [provider]  lisinopril -hydrochlorothiazide  (ZESTORETIC) 10-12.5 MG tablet Take 1 tablet by mouth daily.  07/18/19 07/17/20  [provider]  Multiple Vitamin (MULTI-VITAMIN) tablet Take 1 tablet by mouth 3 (three) times a week.  Patient not taking: Reported on 02/18/2020    [provider]    Allergies: Penicillins    Review of Systems  Respiratory:  Positive for cough.   All other systems reviewed and are negative.   Updated Vital Signs BP (!)  161/96   Pulse 61   Temp 98.7 F (37.1 C) (Oral)   Resp 17   LMP 03/30/2017 (Approximate)   SpO2 98%   Physical Exam Vitals and nursing note reviewed.  Constitutional:      General: She is not in acute distress.    Appearance: She is well-developed.  HENT:     Head: Normocephalic and atraumatic.  Eyes:     Conjunctiva/sclera: Conjunctivae normal.  Cardiovascular:     Rate and Rhythm: Normal rate and regular rhythm.     Heart sounds: No murmur heard. Pulmonary:     Effort: Pulmonary effort is normal. No respiratory distress.     Breath sounds: Rales present. No wheezing.     Comments: Crackles heard to bilateral lung bases Abdominal:     General: Abdomen is flat. Bowel sounds are normal.     Palpations: Abdomen is soft.     Tenderness: There is no abdominal tenderness.  Musculoskeletal:        General: No swelling.     Cervical back: Neck supple.  Skin:    General: Skin is warm and dry.     Capillary Refill: Capillary refill takes less than 2 seconds.  Neurological:     Mental Status: She is alert.  Psychiatric:        Mood and Affect: Mood normal.     (all labs ordered are listed, but only abnormal results are displayed) Labs Reviewed  CBC WITH DIFFERENTIAL/PLATELET - Abnormal; Notable for the following  components:      Result Value   RBC 3.30 (*)    Hemoglobin 10.9 (*)    HCT 32.8 (*)    All other components within normal limits  COMPREHENSIVE METABOLIC PANEL WITH GFR - Abnormal; Notable for the following components:   Sodium 134 (*)    CO2 17 (*)    Glucose, Bld 114 (*)    BUN 29 (*)    Creatinine, Ser 1.53 (*)    AST 113 (*)    ALT 68 (*)    GFR, Estimated 40 (*)    Anion gap 17 (*)    All other components within normal limits  BASIC METABOLIC PANEL WITH GFR - Abnormal; Notable for the following components:   Sodium 133 (*)    CO2 17 (*)    Glucose, Bld 108 (*)    BUN 26 (*)    Creatinine, Ser 1.39 (*)    GFR, Estimated 45 (*)    All other  components within normal limits  RESP PANEL BY RT-PCR (RSV, FLU A&B, COVID)  [id number]    EKG: EKG Interpretation Date/Time:  Thursday March 15 2024 12:55:15 EDT Ventricular Rate:  64 PR Interval:  132 QRS Duration:  95 QT Interval:  395 QTC Calculation: 408 R Axis:   -12  Text Interpretation: Sinus rhythm Confirmed by Zackowski, Scott [phone]) on 03/15/2024 12:58:02 PM  Radiology: [facility] Chest 2 View Result Date: 03/15/2024 CLINICAL DATA:  Chest pain with cough, runny nose and congestion for 2 days. EXAM: CHEST - 2 VIEW COMPARISON:  Radiographs 10/11/2015. FINDINGS: The heart size and mediastinal contours are normal. The lungs are clear. There is no pleural effusion or pneumothorax. No acute osseous findings are identified. Scattered degenerative changes within the spine. IMPRESSION: No evidence of active cardiopulmonary process. Electronically Signed   By: Elsie Perone M.D.   On: 03/15/2024 13:15     Procedures   Medications Ordered in the ED  lactated ringers  bolus 1,000 mL (0 mLs Intravenous Stopped 03/15/24 1502)  loperamide  (IMODIUM ) capsule 4 mg (4 mg Oral Given 03/15/24 1432)  benzonatate  (TESSALON ) capsule 200 mg (200 mg Oral Given 03/15/24 1432)                                    Medical Decision Making Amount and/or Complexity of Data Reviewed Labs: ordered. Radiology: ordered.  Risk Prescription drug management.   This patient presents to the ED for concern of cough, this involves an extensive number of treatment options, and is a complaint that carries with it a high risk of complications and morbidity.  The differential diagnosis includes postviral cough syndrome, viral URI, pneumonia, bronchitis, GERD   Co morbidities that complicate the patient evaluation  Ovarian cancer, recurrent episodes of community-acquired pneumonia, iron deficiency anemia   Lab Tests:  I Ordered, and personally interpreted labs.  The pertinent results include: Respiratory panel negative,  CBC unremarkable, CMP shows some signs of dehydration with creatinine 1.53 and GFR down to 40 with an elevated anion gap at 17 but no recent CMP or renal functioning to compare to.  Appears that she has been downtrending over the last 6 years but unsure what her current baseline is.  Will repeat BMP after fluid resuscitation.  Repeat BMP shows improvement with creatinine down to 1.39 and GFR increasing up to 45.  Anion gap closed now at 15.   Imaging Studies ordered:  I  ordered imaging studies including chest x-ray I independently visualized and interpreted imaging which showed negative for any acute findings I agree with the radiologist interpretation   Cardiac Monitoring: / EKG:  The patient was maintained on a cardiac monitor.  I personally viewed and interpreted the cardiac monitored which showed an underlying rhythm of: Sinus rhythm   Consultations Obtained:  I requested consultation with none,  and discussed lab and imaging findings as well as pertinent plan - they recommend: N/AA   Problem List / ED Course / Critical interventions / Medication management  Patient with past history significant for ovarian cancer, recurrent episodes of Chemo-Port ammonia, and deficiency anemia with concerns of a cough.  Reports that she has had a productive cough, runny nose, congestion for the last several days starting on Tuesday, 2 days ago.  She reports that she has not had any significant treatment with Mucinex.  Denies any fever, chills or bodyaches.  No reported sick contacts.  No vomiting but does endorse some diarrhea that she states are similar between 5-6 episodes per day.  She states that she is still eating and drinking at baseline but has introduced a bland diet try to help with her diarrhea without improvement in symptoms.  Not currently take anything for the diarrhea.  Denies any blood in her stool On exam, patient is well-appearing but is actively coughing.  Auscultation of lungs reveals  some crackles to bilateral lung bases.  Normal heart sounds.  Abdomen is nontender.  Suspect the chest pain is likely secondary from her cough but will obtain an EKG for for evaluation.  Added on CBC and CMP for evaluation given patient's recurrent diarrhea.  Chest x-ray also added on for evaluation possible pneumonia. Lab workup reassuring with signs of dehydration with CMP showing GFR of 40 and creatinine at 1.53.  There is no recent labs to compare to to establish baseline level for her.  Suspect patient has had some level of renal function decline in the last several years but unclear if this is an AKI or not based on most recent labs being years old.  Will provide fluid resuscitation and recheck a BMP afterwards.  Patient given Imodium  and Tessalon  for symptom control. BMP shows closure of anion gap and creatinine improving down to 1.39.  With improvement in renal function, do feel the patient is stable for outpatient follow-up at this time.  She is tolerating p.o. without difficulty.  Tessalon  improving her cough.  Patient denies any significant feelings of needing to have a bowel movement after taking Imodium . Will start patient on Tessalon  at home along with continued use of Imodium . Otherwise stable at this time for outpatient follow up and discharged home. I ordered medication including fluids, Tessalon , Imodium   for dehydration, cough, diarrhea  Reevaluation of the patient after these medicines showed that the patient improved I have reviewed the patients home medicines and have made adjustments as needed   Social Determinants of Health:  None   Test / Admission - Considered:  Considered admission but stable for outpatient follow-up.  Final diagnoses:  Viral URI with cough  Diarrhea of presumed infectious origin  Dehydration    ED Discharge Orders          Ordered    benzonatate  (TESSALON ) 100 MG capsule  Every 8 hours        03/15/24 1601    loperamide  (IMODIUM ) 2 MG capsule   4 times daily PRN        03/15/24  [address]               Cecily Legrand LABOR, PA-C 03/15/24 1606    Dreama Longs, MD 03/15/24 2218
